# Patient Record
Sex: Female | Born: 1975
Health system: Southern US, Community
[De-identification: ages and names within clinical notes are randomized; demographics above are authoritative.]

## PROBLEM LIST (undated history)

## (undated) DIAGNOSIS — N939 Abnormal uterine and vaginal bleeding, unspecified: Principal | ICD-10-CM

## (undated) DIAGNOSIS — I10 Essential (primary) hypertension: Secondary | ICD-10-CM

## (undated) HISTORY — PX: ABDOMINOPLASTY/PANNICULECTOMY: SHX5578

## (undated) HISTORY — PX: NO PAST SURGERIES: SHX2092

## (undated) HISTORY — DX: Essential (primary) hypertension: I10

## (undated) HISTORY — DX: Abnormal uterine and vaginal bleeding, unspecified: N93.9

---

## 2005-05-27 ENCOUNTER — Ambulatory Visit (HOSPITAL_COMMUNITY): Admission: AD | Admit: 2005-05-27 | Discharge: 2005-05-27 | Payer: Self-pay | Admitting: Obstetrics and Gynecology

## 2005-05-30 ENCOUNTER — Inpatient Hospital Stay (HOSPITAL_COMMUNITY): Admission: AD | Admit: 2005-05-30 | Discharge: 2005-06-01 | Payer: Self-pay | Admitting: Obstetrics and Gynecology

## 2007-01-18 ENCOUNTER — Other Ambulatory Visit: Admission: RE | Admit: 2007-01-18 | Discharge: 2007-01-18 | Payer: Self-pay | Admitting: Obstetrics and Gynecology

## 2007-12-28 ENCOUNTER — Other Ambulatory Visit: Admission: RE | Admit: 2007-12-28 | Discharge: 2007-12-28 | Payer: Self-pay | Admitting: Obstetrics and Gynecology

## 2009-03-04 ENCOUNTER — Other Ambulatory Visit: Admission: RE | Admit: 2009-03-04 | Discharge: 2009-03-04 | Payer: Self-pay | Admitting: Obstetrics & Gynecology

## 2010-04-20 ENCOUNTER — Emergency Department (HOSPITAL_COMMUNITY)
Admission: EM | Admit: 2010-04-20 | Discharge: 2010-04-21 | Disposition: A | Discharge status: Home / Self Care | Payer: 59 | Attending: Emergency Medicine | Admitting: Emergency Medicine

## 2010-04-20 DIAGNOSIS — R110 Nausea: Secondary | ICD-10-CM | POA: Insufficient documentation

## 2010-04-20 DIAGNOSIS — H53149 Visual discomfort, unspecified: Secondary | ICD-10-CM | POA: Insufficient documentation

## 2010-04-20 DIAGNOSIS — R51 Headache: Secondary | ICD-10-CM | POA: Insufficient documentation

## 2010-06-05 NOTE — Op Note (Signed)
NAME:  Audrey Coleman, Audrey Coleman                ACCOUNT NO.:  [id number]   MEDICAL RECORD NO.:  [id number]          PATIENT TYPE:  INP   LOCATION:  A401                          FACILITY:  [facility]   PHYSICIAN:  Tilda Burrow, M.D. DATE OF BIRTH:  02/04/1975   DATE OF PROCEDURE:  05/30/2005  DATE OF DISCHARGE:                                 OPERATIVE REPORT   DELIVERY NOTE   [name] progressed steadily through labor, reaching completely dilated  shortly before 5 p.m.  I was called and on my arrival, the patient had  developed the urge to push irresistibly and had delivered just as I arrived  in the room under the controlled sterile vaginal delivery technique by Tawni Millers, RN.  We clamped the cord and placed the patient on the  abdomen. The cord was then cut by a family member.  Cord blood gases were  then obtained and then I delivered the placenta without difficulty. There  was second degree obstetric laceration that was easily repaired under the  effect of the epidural.  The placenta had noted to be marginal insertion of  the cord, but otherwise normal size.  Three vessel cord confirmed.   ESTIMATED BLOOD LOSS:  450 mL.   The patient was allowed to place in sitting position and epidural catheter  removed.  Tip visualized as intact.  Epidural had functioned excellently  throughout labor.  Condition going to recovery time excellent.      Tilda Burrow, M.D.  Electronically Signed     JVF/MEDQ  D:  05/30/2005  T:  05/31/2005  Job:  [id number]

## 2010-06-05 NOTE — Op Note (Signed)
NAME:  Tweedy, Coralee                ACCOUNT NO.:  [id number]   MEDICAL RECORD NO.:  [id number]          PATIENT TYPE:  INP   LOCATION:  A401                          FACILITY:  [facility]   PHYSICIAN:  Tilda Burrow, M.D. DATE OF BIRTH:  October 29, 1975   DATE OF PROCEDURE:  05/30/2005  DATE OF DISCHARGE:                                 OPERATIVE REPORT   OPERATION/PROCEDURE:  Epidural catheter placement.   DESCRIPTION OF PROCEDURE:  Continuous lumbar epidural placed on first  attempt at L2-3 interspace at a depth of 7 cm.  Prepping and draping of the  back was performed in the standard fashion.  Loss-of-resistance technique  used to identify the epidural space with 5 mL bolus of 1.25% lidocaine with  epinephrine injected.  Then epidural catheter placed 2 cm into the epidural  space and 2-3 cm.  It was stopped at that point where it met slight  resistance.  The catheter was taped to the back very firmly due to patient's  very mobile lax skin.  The patient had excellent analgesic effect, symmetric  at T10 level quickly developed.  She had a 9 mL bolus with Marcaine 0.125%  solution followed by 12 mL per hour continuous infusion.      Tilda Burrow, M.D.  Electronically Signed     JVF/MEDQ  D:  05/30/2005  T:  05/31/2005  Job:  [id number]

## 2010-06-05 NOTE — H&P (Signed)
NAME:  Audrey Coleman, Audrey Coleman                ACCOUNT NO.:  [id number]   MEDICAL RECORD NO.:  [id number]          PATIENT TYPE:  INP   LOCATION:  LDR2                          FACILITY:  [facility]   PHYSICIAN:  Tilda Burrow, M.D. DATE OF BIRTH:  01-30-1975   DATE OF ADMISSION:  05/30/2005  DATE OF DISCHARGE:  LH                                HISTORY & PHYSICAL   ADMISSION DIAGNOSIS:  Pregnancy at 39.[redacted] weeks gestation, prodrome of labor.   HISTORY OF PRESENT ILLNESS:  This is a 35 year old female, para 2, para 1,  AB0, LMP August 26, 2004, placing [facility] Jun 02, 2005, with corresponding first  and second trimester ultrasounds, was admitted after pregnancy course  followed in our office. She has had an uncomplicated course through 13  prenatal visits, with appropriate weight gain and fundal height growth.  Cervix is 3-4 cm, significantly unchanged from last prenatal visit.  Discussion with the patient who considered going home, but after discussion  with options and anticipation that she will be back shortly, she agrees to  go ahead to come in. Membranes are intact. There is light bloody show seen.  Amniotomy is not done at the time of admission, but will begin pitocin  augmentation if labor does not improve spontaneously in the next hour.   PAST MEDICAL HISTORY:  Benign.   PAST SURGICAL HISTORY:  Negative.   ALLERGIES:  None.   Height 5 feet 1 inch, weight 234 pounds which is a 44 weight gain, fundal  height term size fetus, vertex presentation, estimated fetal weight 7.5 to 8  pounds. Cervix 3-4 cm. On nursing evaluation vertex presentation confirmed.   PLAN:  Pitocin augmentation of labor after one hour. Good prognosis of  vaginal delivery.   ADDENDUM:  Prenatal labs reveal blood type O positive, urine drug screen  negative, rubella immune. Hemoglobin 13, hematocrit 40. Hepatitis, HIV, RPR,  GC, and Chlamydia all negative. Repeat GC and Chlamydia. Glucose tolerance  test 111 mg% at 28  weeks.   PLAN:  The patient plans epidural, breast feeding, bottle supplementation  with subsequent IUD use planned.      Tilda Burrow, M.D.  Electronically Signed     JVF/MEDQ  D:  05/30/2005  T:  05/30/2005  Job:  [id number]   cc:   Lorin Picket A. Gerda Diss, MD  Fax: [phone]

## 2012-01-06 ENCOUNTER — Other Ambulatory Visit (HOSPITAL_COMMUNITY)
Admission: RE | Admit: 2012-01-06 | Discharge: 2012-01-06 | Disposition: A | Discharge status: Home / Self Care | Payer: 59 | Source: Ambulatory Visit | Attending: Obstetrics and Gynecology | Admitting: Obstetrics and Gynecology

## 2012-01-06 DIAGNOSIS — Z1151 Encounter for screening for human papillomavirus (HPV): Secondary | ICD-10-CM | POA: Insufficient documentation

## 2012-01-06 DIAGNOSIS — Z01419 Encounter for gynecological examination (general) (routine) without abnormal findings: Secondary | ICD-10-CM | POA: Insufficient documentation

## 2012-01-19 NOTE — L&D Delivery Note (Signed)
Attestation of Attending Supervision of Advanced Practitioner (CNM/NP): Evaluation and management procedures were performed by the Advanced Practitioner under my supervision and collaboration. I have reviewed the Advanced Practitioner's note and chart, and I agree with the management and plan.  LEGGETT,KELLY H. 4:39 PM

## 2012-01-19 NOTE — L&D Delivery Note (Signed)
Delivery Note Pt progressed rapidly to C/C/+3 to delivering the infant over barley one push. RN delivered the baby, Dr. Madie Reno was in the room putting on her gloves. At 5:47 PM a viable female was delivered via Vaginal, Spontaneous Delivery (Presentation: vertex  ).  APGAR: 9,9 ; weight - not yet weighed.   Placenta status: Intact, Spontaneous. No Nuchal cord. No complications  Anesthesia: None  Episiotomy: None Lacerations: 2nd degree Suture Repair: 3.0 vicryl Est. Blood Loss (mL): 300  Mom to postpartum.  Baby to nursery-stable.  Marissa Calamity 09/28/2012, 6:15 PM  I was present a few minutes after the precipitous birth until after the repair.  CRESENZO-DISHMAN,Quintyn Dombek

## 2012-02-23 LAB — OB RESULTS CONSOLE HIV ANTIBODY (ROUTINE TESTING): HIV: NONREACTIVE

## 2012-02-23 LAB — OB RESULTS CONSOLE RUBELLA ANTIBODY, IGM: Rubella: IMMUNE

## 2012-02-23 LAB — OB RESULTS CONSOLE ABO/RH

## 2012-02-23 LAB — OB RESULTS CONSOLE VARICELLA ZOSTER ANTIBODY, IGG: Varicella: IMMUNE

## 2012-02-23 LAB — OB RESULTS CONSOLE RPR: RPR: NONREACTIVE

## 2012-03-27 ENCOUNTER — Other Ambulatory Visit: Payer: Self-pay | Admitting: Obstetrics & Gynecology

## 2012-03-27 DIAGNOSIS — Z36 Encounter for antenatal screening of mother: Secondary | ICD-10-CM

## 2012-03-29 ENCOUNTER — Encounter: Payer: Self-pay | Admitting: *Deleted

## 2012-03-29 DIAGNOSIS — I10 Essential (primary) hypertension: Secondary | ICD-10-CM

## 2012-04-05 ENCOUNTER — Ambulatory Visit (INDEPENDENT_AMBULATORY_CARE_PROVIDER_SITE_OTHER): Payer: 59

## 2012-04-05 ENCOUNTER — Encounter: Payer: Self-pay | Admitting: Advanced Practice Midwife

## 2012-04-05 ENCOUNTER — Other Ambulatory Visit: Payer: Self-pay | Admitting: Advanced Practice Midwife

## 2012-04-05 ENCOUNTER — Ambulatory Visit (INDEPENDENT_AMBULATORY_CARE_PROVIDER_SITE_OTHER): Payer: 59 | Admitting: Advanced Practice Midwife

## 2012-04-05 VITALS — BP 104/80 | Wt 226.0 lb

## 2012-04-05 DIAGNOSIS — Z36 Encounter for antenatal screening of mother: Secondary | ICD-10-CM

## 2012-04-05 DIAGNOSIS — Z3400 Encounter for supervision of normal first pregnancy, unspecified trimester: Secondary | ICD-10-CM

## 2012-04-05 DIAGNOSIS — O10019 Pre-existing essential hypertension complicating pregnancy, unspecified trimester: Secondary | ICD-10-CM

## 2012-04-05 DIAGNOSIS — O10911 Unspecified pre-existing hypertension complicating pregnancy, first trimester: Secondary | ICD-10-CM

## 2012-04-05 DIAGNOSIS — O09529 Supervision of elderly multigravida, unspecified trimester: Secondary | ICD-10-CM

## 2012-04-05 LAB — PROTEIN, URINE, 24 HOUR: Total Protein: 60 g/dL

## 2012-04-05 LAB — POCT URINALYSIS DIPSTICK
Leukocytes, UA: NEGATIVE
Protein, UA: NEGATIVE

## 2012-04-05 NOTE — Progress Notes (Signed)
Had NT today.  No c/o

## 2012-04-05 NOTE — Progress Notes (Unsigned)
U/s-single IUP with +FCA noted, CRL c/w dates, NT-1.77mm, NB present, cx long and closed, bilateral adnexa wnl-TMM

## 2012-04-05 NOTE — Addendum Note (Signed)
Addended by: Annett Fabian on: 04/05/2012 01:00 PM   Modules accepted: Orders

## 2012-04-13 LAB — MATERNAL SCREEN, INTEGRATED #1
Crown Rump Length: 63.3 mm
Nuchal translucency: 1.54 mm
Number of fetuses: 1
Referring Physician NPI: 1881783975
Referring physician phone: 3363426063

## 2012-05-01 ENCOUNTER — Other Ambulatory Visit: Payer: Self-pay | Admitting: Obstetrics & Gynecology

## 2012-05-01 ENCOUNTER — Ambulatory Visit (INDEPENDENT_AMBULATORY_CARE_PROVIDER_SITE_OTHER): Payer: 59 | Admitting: Obstetrics & Gynecology

## 2012-05-01 ENCOUNTER — Encounter: Payer: Self-pay | Admitting: Obstetrics & Gynecology

## 2012-05-01 VITALS — BP 110/80 | Wt 228.0 lb

## 2012-05-01 DIAGNOSIS — O10019 Pre-existing essential hypertension complicating pregnancy, unspecified trimester: Secondary | ICD-10-CM

## 2012-05-01 DIAGNOSIS — O10912 Unspecified pre-existing hypertension complicating pregnancy, second trimester: Secondary | ICD-10-CM

## 2012-05-01 DIAGNOSIS — O09529 Supervision of elderly multigravida, unspecified trimester: Secondary | ICD-10-CM

## 2012-05-01 DIAGNOSIS — O0990 Supervision of high risk pregnancy, unspecified, unspecified trimester: Secondary | ICD-10-CM

## 2012-05-01 LAB — POCT URINALYSIS DIPSTICK
Blood, UA: NEGATIVE
Glucose, UA: NEGATIVE
Nitrite, UA: NEGATIVE

## 2012-05-01 NOTE — Progress Notes (Signed)
BP weight and urine results all reviewed and noted. Patient reports good fetal movement, denies any bleeding and no rupture of membranes symptoms or regular contractions. Patient is without complaints. All questions were answered. 2nd IT today and sonogram next visit

## 2012-05-01 NOTE — Patient Instructions (Signed)

## 2012-05-04 LAB — MATERNAL SCREEN, INTEGRATED #2
AFP, Serum: 32.4 ng/mL
Age risk Down Syndrome: 1:190 {titer}
Calculated Gestational Age: 16.3
Inhibin A Dimeric: 156 pg/mL
Inhibin A MoM: 1.09
MSS Down Syndrome: <1:5000 {titer}
MSS Trisomy 18 Risk: <1:5000 {titer}
NT MoM: 1.07
Number of fetuses: 1
PAPP-A: 869 ng/mL
Rish for ONTD: <1:5000 {titer}
hCG MoM: 0.79

## 2012-05-23 ENCOUNTER — Encounter: Payer: Self-pay | Admitting: Women's Health

## 2012-05-23 ENCOUNTER — Ambulatory Visit (INDEPENDENT_AMBULATORY_CARE_PROVIDER_SITE_OTHER): Payer: 59 | Admitting: Women's Health

## 2012-05-23 ENCOUNTER — Telehealth: Payer: Self-pay | Admitting: Obstetrics and Gynecology

## 2012-05-23 VITALS — BP 120/80 | Wt 228.8 lb

## 2012-05-23 DIAGNOSIS — O09522 Supervision of elderly multigravida, second trimester: Secondary | ICD-10-CM

## 2012-05-23 DIAGNOSIS — O10019 Pre-existing essential hypertension complicating pregnancy, unspecified trimester: Secondary | ICD-10-CM

## 2012-05-23 DIAGNOSIS — IMO0002 Reserved for concepts with insufficient information to code with codable children: Secondary | ICD-10-CM

## 2012-05-23 DIAGNOSIS — O10912 Unspecified pre-existing hypertension complicating pregnancy, second trimester: Secondary | ICD-10-CM

## 2012-05-23 DIAGNOSIS — Z331 Pregnant state, incidental: Secondary | ICD-10-CM

## 2012-05-23 DIAGNOSIS — O9989 Other specified diseases and conditions complicating pregnancy, childbirth and the puerperium: Secondary | ICD-10-CM

## 2012-05-23 DIAGNOSIS — Z1389 Encounter for screening for other disorder: Secondary | ICD-10-CM

## 2012-05-23 DIAGNOSIS — O26892 Other specified pregnancy related conditions, second trimester: Secondary | ICD-10-CM

## 2012-05-23 DIAGNOSIS — O09529 Supervision of elderly multigravida, unspecified trimester: Secondary | ICD-10-CM

## 2012-05-23 DIAGNOSIS — B373 Candidiasis of vulva and vagina: Secondary | ICD-10-CM

## 2012-05-23 LAB — POCT URINALYSIS DIPSTICK
Nitrite, UA: NEGATIVE
Protein, UA: NEGATIVE

## 2012-05-23 LAB — POCT WET PREP (WET MOUNT): Clue Cells Wet Prep Whiff POC: NEGATIVE

## 2012-05-23 MED ORDER — AMLODIPINE BESYLATE 5 MG PO TABS: 5.0000 mg | ORAL_TABLET | Freq: Every day | ORAL | Status: DC

## 2012-05-23 MED ORDER — FLUCONAZOLE 150 MG PO TABS: 150.0000 mg | ORAL_TABLET | Freq: Once | ORAL | Status: DC

## 2012-05-23 NOTE — Telephone Encounter (Signed)
Pt states been having contractions since 4 am this morning with a yellowish mucus discharge, no bleeding, not feeling the baby move "as much". Pt told to be here today to see provider at 2:15 pm

## 2012-05-23 NOTE — Patient Instructions (Addendum)
Increase your water intake until your urine is clear.  Rest as much as possible.  No sex until cramping stops.  Notify us or go to [facility] hospital for increasing cramps, leakage of fluid like your water broke, or vaginal bleeding. Monilial Vaginitis Vaginitis in a soreness, swelling and redness (inflammation) of the vagina and vulva. Monilial vaginitis is not a sexually transmitted infection. CAUSES  Yeast vaginitis is caused by yeast (candida) that is normally found in your vagina. With a yeast infection, the candida has overgrown in number to a point that upsets the chemical balance. SYMPTOMS   Rodd, thick vaginal discharge.  Swelling, itching, redness and irritation of the vagina and possibly the lips of the vagina (vulva).  Burning or painful urination.  Painful intercourse. DIAGNOSIS  Things that may contribute to monilial vaginitis are:  Postmenopausal and virginal states.  Pregnancy.  Infections.  Being tired, sick or stressed, especially if you had monilial vaginitis in the past.  Diabetes. Good control will help lower the chance.  Birth control pills.  Tight fitting garments.  Using bubble bath, feminine sprays, douches or deodorant tampons.  Taking certain medications that kill germs (antibiotics).  Sporadic recurrence can occur if you become ill. TREATMENT  Your caregiver will give you medication.  There are several kinds of anti monilial vaginal creams and suppositories specific for monilial vaginitis. For recurrent yeast infections, use a suppository or cream in the vagina 2 times a week, or as directed.  Anti-monilial or steroid cream for the itching or irritation of the vulva may also be used. Get your caregiver's permission.  Painting the vagina with methylene blue solution may help if the monilial cream does not work.  Eating yogurt may help prevent monilial vaginitis. HOME CARE INSTRUCTIONS   Finish all medication as prescribed.  Do not have sex  until treatment is completed or after your caregiver tells you it is okay.  Take warm sitz baths.  Do not douche.  Do not use tampons, especially scented ones.  Wear cotton underwear.  Avoid tight pants and panty hose.  Tell your sexual partner that you have a yeast infection. They should go to their caregiver if they have symptoms such as mild rash or itching.  Your sexual partner should be treated as well if your infection is difficult to eliminate.  Practice safer sex. Use condoms.  Some vaginal medications cause latex condoms to fail. Vaginal medications that harm condoms are:  Cleocin cream.  Butoconazole (Femstat).  Terconazole (Terazol) vaginal suppository.  Miconazole (Monistat) (may be purchased over the counter). SEEK MEDICAL CARE IF:   You have a temperature by mouth above 102 F (38.9 C).  The infection is getting worse after 2 days of treatment.  The infection is not getting better after 3 days of treatment.  You develop blisters in or around your vagina.  You develop vaginal bleeding, and it is not your menstrual period.  You have pain when you urinate.  You develop intestinal problems.  You have pain with sexual intercourse. Document Released: 10/14/2004 Document Revised: 03/29/2011 Document Reviewed: 06/28/2008 [facility] Patient Information 2013 [place], [place].

## 2012-05-23 NOTE — Progress Notes (Signed)
Reports good fm. Denies lof, vb, urinary frequency, urgency, hesitancy, or dysuria.  Has had cramps all day today, yellowish non-odorous d/c since yesterday, w/ occasional itching. Spec exam: cx visually closed, small amount whitish-yellow non-odorous d/c, wet prep + yeast. Rx diflucan. Increase po fluids, rest, pelvic rest. Reviewed warning s/s to report.  Refilled norvasc. Keep next appt as scheduled on mon for anatomy u/s and visit. All questions answered.

## 2012-05-23 NOTE — Progress Notes (Signed)
Starts as a cramp then stomach tightens up. Vaginal discharge.

## 2012-05-29 ENCOUNTER — Encounter: Payer: Self-pay | Admitting: Women's Health

## 2012-05-29 ENCOUNTER — Ambulatory Visit (INDEPENDENT_AMBULATORY_CARE_PROVIDER_SITE_OTHER): Payer: 59

## 2012-05-29 ENCOUNTER — Other Ambulatory Visit: Payer: Self-pay | Admitting: Obstetrics & Gynecology

## 2012-05-29 ENCOUNTER — Ambulatory Visit (INDEPENDENT_AMBULATORY_CARE_PROVIDER_SITE_OTHER): Payer: 59 | Admitting: Women's Health

## 2012-05-29 VITALS — BP 116/80 | Wt 230.0 lb

## 2012-05-29 DIAGNOSIS — O09522 Supervision of elderly multigravida, second trimester: Secondary | ICD-10-CM

## 2012-05-29 DIAGNOSIS — O10012 Pre-existing essential hypertension complicating pregnancy, second trimester: Secondary | ICD-10-CM

## 2012-05-29 DIAGNOSIS — O10019 Pre-existing essential hypertension complicating pregnancy, unspecified trimester: Secondary | ICD-10-CM

## 2012-05-29 DIAGNOSIS — O09529 Supervision of elderly multigravida, unspecified trimester: Secondary | ICD-10-CM

## 2012-05-29 DIAGNOSIS — Z1389 Encounter for screening for other disorder: Secondary | ICD-10-CM

## 2012-05-29 DIAGNOSIS — Z331 Pregnant state, incidental: Secondary | ICD-10-CM

## 2012-05-29 DIAGNOSIS — O10912 Unspecified pre-existing hypertension complicating pregnancy, second trimester: Secondary | ICD-10-CM

## 2012-05-29 DIAGNOSIS — O09899 Supervision of other high risk pregnancies, unspecified trimester: Secondary | ICD-10-CM

## 2012-05-29 DIAGNOSIS — O0990 Supervision of high risk pregnancy, unspecified, unspecified trimester: Secondary | ICD-10-CM

## 2012-05-29 LAB — POCT URINALYSIS DIPSTICK
Glucose, UA: NEGATIVE
Leukocytes, UA: NEGATIVE
Nitrite, UA: NEGATIVE

## 2012-05-29 NOTE — Patient Instructions (Addendum)
Pregnancy - Second Trimester The second trimester of pregnancy (3 to 6 months) is a period of rapid growth for you and your baby. At the end of the sixth month, your baby is about 9 inches long and weighs 1 1/2 pounds. You will begin to feel the baby move between 18 and 20 weeks of the pregnancy. This is called quickening. Weight gain is faster. A clear fluid (colostrum) may leak out of your breasts. You may feel small contractions of the womb (uterus). This is known as false labor or Braxton-Hicks contractions. This is like a practice for labor when the baby is ready to be born. Usually, the problems with morning sickness have usually passed by the end of your first trimester. Some women develop small dark blotches (called cholasma, mask of pregnancy) on their face that usually goes away after the baby is born. Exposure to the sun makes the blotches worse. Acne may also develop in some pregnant women and pregnant women who have acne, may find that it goes away. PRENATAL EXAMS  Blood work may continue to be done during prenatal exams. These tests are done to check on your health and the probable health of your baby. Blood work is used to follow your blood levels (hemoglobin). Anemia (low hemoglobin) is common during pregnancy. Iron and vitamins are given to help prevent this. You will also be checked for diabetes between 24 and 28 weeks of the pregnancy. Some of the previous blood tests may be repeated.  The size of the uterus is measured during each visit. This is to make sure that the baby is continuing to grow properly according to the dates of the pregnancy.  Your blood pressure is checked every prenatal visit. This is to make sure you are not getting toxemia.  Your urine is checked to make sure you do not have an infection, diabetes or protein in the urine.  Your weight is checked often to make sure gains are happening at the suggested rate. This is to ensure that both you and your baby are growing  normally.  Sometimes, an ultrasound is performed to confirm the proper growth and development of the baby. This is a test which bounces harmless sound waves off the baby so your caregiver can more accurately determine due dates. Sometimes, a specialized test is done on the amniotic fluid surrounding the baby. This test is called an amniocentesis. The amniotic fluid is obtained by sticking a needle into the belly (abdomen). This is done to check the chromosomes in instances where there is a concern about possible genetic problems with the baby. It is also sometimes done near the end of pregnancy if an early delivery is required. In this case, it is done to help make sure the baby's lungs are mature enough for the baby to live outside of the womb. CHANGES OCCURING IN THE SECOND TRIMESTER OF PREGNANCY Your body goes through many changes during pregnancy. They vary from person to person. Talk to your caregiver about changes you notice that you are concerned about.  During the second trimester, you will likely have an increase in your appetite. It is normal to have cravings for certain foods. This varies from person to person and pregnancy to pregnancy.  Your lower abdomen will begin to bulge.  You may have to urinate more often because the uterus and baby are pressing on your bladder. It is also common to get more bladder infections during pregnancy (pain with urination). You can help this by   drinking lots of fluids and emptying your bladder before and after intercourse.  You may begin to get stretch marks on your hips, abdomen, and breasts. These are normal changes in the body during pregnancy. There are no exercises or medications to take that prevent this change.  You may begin to develop swollen and bulging veins (varicose veins) in your legs. Wearing support hose, elevating your feet for 15 minutes, 3 to 4 times a day and limiting salt in your diet helps lessen the problem.  Heartburn may develop  as the uterus grows and pushes up against the stomach. Antacids recommended by your caregiver helps with this problem. Also, eating smaller meals 4 to 5 times a day helps.  Constipation can be treated with a stool softener or adding bulk to your diet. Drinking lots of fluids, vegetables, fruits, and whole grains are helpful.  Exercising is also helpful. If you have been very active up until your pregnancy, most of these activities can be continued during your pregnancy. If you have been less active, it is helpful to start an exercise program such as walking.  Hemorrhoids (varicose veins in the rectum) may develop at the end of the second trimester. Warm sitz baths and hemorrhoid cream recommended by your caregiver helps hemorrhoid problems.  Backaches may develop during this time of your pregnancy. Avoid heavy lifting, wear low heal shoes and practice good posture to help with backache problems.  Some pregnant women develop tingling and numbness of their hand and fingers because of swelling and tightening of ligaments in the wrist (carpel tunnel syndrome). This goes away after the baby is born.  As your breasts enlarge, you may have to get a bigger bra. Get a comfortable, cotton, support bra. Do not get a nursing bra until the last month of the pregnancy if you will be nursing the baby.  You may get a dark line from your belly button to the pubic area called the linea nigra.  You may develop rosy cheeks because of increase blood flow to the face.  You may develop spider looking lines of the face, neck, arms and chest. These go away after the baby is born. HOME CARE INSTRUCTIONS   It is extremely important to avoid all smoking, herbs, alcohol, and unprescribed drugs during your pregnancy. These chemicals affect the formation and growth of the baby. Avoid these chemicals throughout the pregnancy to ensure the delivery of a healthy infant.  Most of your home care instructions are the same as  suggested for the first trimester of your pregnancy. Keep your caregiver's appointments. Follow your caregiver's instructions regarding medication use, exercise and diet.  During pregnancy, you are providing food for you and your baby. Continue to eat regular, well-balanced meals. Choose foods such as meat, fish, milk and other low fat dairy products, vegetables, fruits, and whole-grain breads and cereals. Your caregiver will tell you of the ideal weight gain.  A physical sexual relationship may be continued up until near the end of pregnancy if there are no other problems. Problems could include early (premature) leaking of amniotic fluid from the membranes, vaginal bleeding, abdominal pain, or other medical or pregnancy problems.  Exercise regularly if there are no restrictions. Check with your caregiver if you are unsure of the safety of some of your exercises. The greatest weight gain will occur in the last 2 trimesters of pregnancy. Exercise will help you:  Control your weight.  Get you in shape for labor and delivery.  Lose weight   after you have the baby.  Wear a good support or jogging bra for breast tenderness during pregnancy. This may help if worn during sleep. Pads or tissues may be used in the bra if you are leaking colostrum.  Do not use hot tubs, steam rooms or saunas throughout the pregnancy.  Wear your seat belt at all times when driving. This protects you and your baby if you are in an accident.  Avoid raw meat, uncooked cheese, cat litter boxes and soil used by cats. These carry germs that can cause birth defects in the baby.  The second trimester is also a good time to visit your dentist for your dental health if this has not been done yet. Getting your teeth cleaned is OK. Use a soft toothbrush. Brush gently during pregnancy.  It is easier to loose urine during pregnancy. Tightening up and strengthening the pelvic muscles will help with this problem. Practice stopping your  urination while you are going to the bathroom. These are the same muscles you need to strengthen. It is also the muscles you would use as if you were trying to stop from passing gas. You can practice tightening these muscles up 10 times a set and repeating this about 3 times per day. Once you know what muscles to tighten up, do not perform these exercises during urination. It is more likely to contribute to an infection by backing up the urine.  Ask for help if you have financial, counseling or nutritional needs during pregnancy. Your caregiver will be able to offer counseling for these needs as well as refer you for other special needs.  Your skin may become oily. If so, wash your face with mild soap, use non-greasy moisturizer and oil or cream based makeup. MEDICATIONS AND DRUG USE IN PREGNANCY  Take prenatal vitamins as directed. The vitamin should contain 1 milligram of folic acid. Keep all vitamins out of reach of children. Only a couple vitamins or tablets containing iron may be fatal to a baby or young child when ingested.  Avoid use of all medications, including herbs, over-the-counter medications, not prescribed or suggested by your caregiver. Only take over-the-counter or prescription medicines for pain, discomfort, or fever as directed by your caregiver. Do not use aspirin.  Let your caregiver also know about herbs you may be using.  Alcohol is related to a number of birth defects. This includes fetal alcohol syndrome. All alcohol, in any form, should be avoided completely. Smoking will cause low birth rate and premature babies.  Street or illegal drugs are very harmful to the baby. They are absolutely forbidden. A baby born to an addicted mother will be addicted at birth. The baby will go through the same withdrawal an adult does. SEEK MEDICAL CARE IF:  You have any concerns or worries during your pregnancy. It is better to call with your questions if you feel they cannot wait, rather  than worry about them. SEEK IMMEDIATE MEDICAL CARE IF:   An unexplained oral temperature above 102 F (38.9 C) develops, or as your caregiver suggests.  You have leaking of fluid from the vagina (birth canal). If leaking membranes are suspected, take your temperature and tell your caregiver of this when you call.  There is vaginal spotting, bleeding, or passing clots. Tell your caregiver of the amount and how many pads are used. Light spotting in pregnancy is common, especially following intercourse.  You develop a bad smelling vaginal discharge with a change in the color from clear   to [place].  You continue to feel sick to your stomach (nauseated) and have no relief from remedies suggested. You vomit blood or coffee ground-like materials.  You lose more than 2 pounds of weight or gain more than 2 pounds of weight over 1 week, or as suggested by your caregiver.  You notice swelling of your face, hands, feet, or legs.  You get exposed to German measles and have never had them.  You are exposed to fifth disease or chickenpox.  You develop belly (abdominal) pain. Round ligament discomfort is a common non-cancerous (benign) cause of abdominal pain in pregnancy. Your caregiver still must evaluate you.  You develop a bad headache that does not go away.  You develop fever, diarrhea, pain with urination, or shortness of breath.  You develop visual problems, blurry, or double vision.  You fall or are in a car accident or any kind of trauma.  There is mental or physical violence at home. Document Released: 12/29/2000 Document Revised: 03/29/2011 Document Reviewed: 07/03/2008 [employer] Patient Information 2013 [employer].  

## 2012-05-29 NOTE — Progress Notes (Signed)
U/S (20+2wks)-active fetus, meas c/w dates, fluid wnl, ant gr 0 plac, no obvious abnl noted, female fetus, cx long and closed, bilateral adnexa wnl

## 2012-05-29 NOTE — Progress Notes (Signed)
Pressure sometimes. Still feels baby move but not as much.

## 2012-05-29 NOTE — Progress Notes (Signed)
States she doesn't feel like baby is moving much- but just saw lots of fm on u/s screen that she didn't perceived. Reviewed results of u/s today. Denies uc's, lof, vb, urinary frequency, urgency, hesitancy, or dysuria.  No complaints.  All questions answered. Declines childbirth classes, but desires tour of [facility]- info given. 4wks for LROB

## 2012-06-01 LAB — US OB DETAIL + 14 WK

## 2012-06-16 ENCOUNTER — Encounter: Payer: Self-pay | Admitting: Adult Health

## 2012-06-16 ENCOUNTER — Ambulatory Visit (INDEPENDENT_AMBULATORY_CARE_PROVIDER_SITE_OTHER): Payer: 59 | Admitting: Adult Health

## 2012-06-16 VITALS — BP 122/88 | Wt 230.0 lb

## 2012-06-16 DIAGNOSIS — B379 Candidiasis, unspecified: Secondary | ICD-10-CM

## 2012-06-16 DIAGNOSIS — O2390 Unspecified genitourinary tract infection in pregnancy, unspecified trimester: Secondary | ICD-10-CM

## 2012-06-16 DIAGNOSIS — Z331 Pregnant state, incidental: Secondary | ICD-10-CM

## 2012-06-16 DIAGNOSIS — Z1389 Encounter for screening for other disorder: Secondary | ICD-10-CM

## 2012-06-16 LAB — POCT URINALYSIS DIPSTICK
Glucose, UA: NEGATIVE
Ketones, UA: NEGATIVE

## 2012-06-16 LAB — POCT WET PREP (WET MOUNT): WBC, Wet Prep HPF POC: NEGATIVE

## 2012-06-16 MED ORDER — FLUCONAZOLE 150 MG PO TABS: ORAL_TABLET | ORAL | Status: DC

## 2012-06-16 NOTE — Progress Notes (Signed)
C/o clear to Rickel thick discharge with odor, itching and irritation.

## 2012-06-16 NOTE — Progress Notes (Signed)
Has discharge and itching wet prep+ yeast will Rx diflucan 150 mg 1 now and 1 in 3 days if needed, with 1 refill.Cervix closed, no bleeding, good fetal movement,call prn, return as scheduled

## 2012-06-16 NOTE — Patient Instructions (Addendum)
Monilial Vaginitis Vaginitis in a soreness, swelling and redness (inflammation) of the vagina and vulva. Monilial vaginitis is not a sexually transmitted infection. CAUSES  Yeast vaginitis is caused by yeast (candida) that is normally found in your vagina. With a yeast infection, the candida has overgrown in number to a point that upsets the chemical balance. SYMPTOMS   Biddy, thick vaginal discharge.  Swelling, itching, redness and irritation of the vagina and possibly the lips of the vagina (vulva).  Burning or painful urination.  Painful intercourse. DIAGNOSIS  Things that may contribute to monilial vaginitis are:  Postmenopausal and virginal states.  Pregnancy.  Infections.  Being tired, sick or stressed, especially if you had monilial vaginitis in the past.  Diabetes. Good control will help lower the chance.  Birth control pills.  Tight fitting garments.  Using bubble bath, feminine sprays, douches or deodorant tampons.  Taking certain medications that kill germs (antibiotics).  Sporadic recurrence can occur if you become ill. TREATMENT  Your caregiver will give you medication.  There are several kinds of anti monilial vaginal creams and suppositories specific for monilial vaginitis. For recurrent yeast infections, use a suppository or cream in the vagina 2 times a week, or as directed.  Anti-monilial or steroid cream for the itching or irritation of the vulva may also be used. Get your caregiver's permission.  Painting the vagina with methylene blue solution may help if the monilial cream does not work.  Eating yogurt may help prevent monilial vaginitis. HOME CARE INSTRUCTIONS   Finish all medication as prescribed.  Do not have sex until treatment is completed or after your caregiver tells you it is okay.  Take warm sitz baths.  Do not douche.  Do not use tampons, especially scented ones.  Wear cotton underwear.  Avoid tight pants and panty  hose.  Tell your sexual partner that you have a yeast infection. They should go to their caregiver if they have symptoms such as mild rash or itching.  Your sexual partner should be treated as well if your infection is difficult to eliminate.  Practice safer sex. Use condoms.  Some vaginal medications cause latex condoms to fail. Vaginal medications that harm condoms are:  Cleocin cream.  Butoconazole (Femstat).  Terconazole (Terazol) vaginal suppository.  Miconazole (Monistat) (may be purchased over the counter). SEEK MEDICAL CARE IF:   You have a temperature by mouth above 102 F (38.9 C).  The infection is getting worse after 2 days of treatment.  The infection is not getting better after 3 days of treatment.  You develop blisters in or around your vagina.  You develop vaginal bleeding, and it is not your menstrual period.  You have pain when you urinate.  You develop intestinal problems.  You have pain with sexual intercourse. Document Released: 10/14/2004 Document Revised: 03/29/2011 Document Reviewed: 06/28/2008 [facility] Patient Information 2014 [place], [place]. Take diflucan Follow as scheduled

## 2012-06-26 ENCOUNTER — Encounter: Payer: Self-pay | Admitting: Women's Health

## 2012-06-26 ENCOUNTER — Ambulatory Visit (INDEPENDENT_AMBULATORY_CARE_PROVIDER_SITE_OTHER): Payer: 59 | Admitting: Women's Health

## 2012-06-26 VITALS — BP 120/80 | Wt 238.0 lb

## 2012-06-26 DIAGNOSIS — Z331 Pregnant state, incidental: Secondary | ICD-10-CM

## 2012-06-26 DIAGNOSIS — Z1389 Encounter for screening for other disorder: Secondary | ICD-10-CM

## 2012-06-26 DIAGNOSIS — O10019 Pre-existing essential hypertension complicating pregnancy, unspecified trimester: Secondary | ICD-10-CM

## 2012-06-26 DIAGNOSIS — O09899 Supervision of other high risk pregnancies, unspecified trimester: Secondary | ICD-10-CM

## 2012-06-26 DIAGNOSIS — O09529 Supervision of elderly multigravida, unspecified trimester: Secondary | ICD-10-CM

## 2012-06-26 LAB — POCT URINALYSIS DIPSTICK
Blood, UA: NEGATIVE
Glucose, UA: NEGATIVE
Nitrite, UA: NEGATIVE

## 2012-06-26 NOTE — Progress Notes (Signed)
Reports good fm. Denies uc's, lof, vb, urinary frequency, urgency, hesitancy, or dysuria.  Reports LBP, recommended maternity belt and reviewed relief measures.  Reviewed ptl s/s, fm, s/s to report.  All questions answered. F/U in 4wks for PN2, growth/afi u/s, and visit.

## 2012-06-26 NOTE — Patient Instructions (Addendum)
You will have your sugar test next visit.  Please do not eat or drink anything after midnight the night before you come, not even water.  You will be here for at least two hours.     Pregnancy - Second Trimester The second trimester of pregnancy (3 to 6 months) is a period of rapid growth for you and your baby. At the end of the sixth month, your baby is about 9 inches long and weighs 1 1/2 pounds. You will begin to feel the baby move between 18 and 20 weeks of the pregnancy. This is called quickening. Weight gain is faster. A clear fluid (colostrum) may leak out of your breasts. You may feel small contractions of the womb (uterus). This is known as false labor or Braxton-Hicks contractions. This is like a practice for labor when the baby is ready to be born. Usually, the problems with morning sickness have usually passed by the end of your first trimester. Some women develop small dark blotches (called cholasma, mask of pregnancy) on their face that usually goes away after the baby is born. Exposure to the sun makes the blotches worse. Acne may also develop in some pregnant women and pregnant women who have acne, may find that it goes away. PRENATAL EXAMS  Blood work may continue to be done during prenatal exams. These tests are done to check on your health and the probable health of your baby. Blood work is used to follow your blood levels (hemoglobin). Anemia (low hemoglobin) is common during pregnancy. Iron and vitamins are given to help prevent this. You will also be checked for diabetes between 24 and 28 weeks of the pregnancy. Some of the previous blood tests may be repeated.  The size of the uterus is measured during each visit. This is to make sure that the baby is continuing to grow properly according to the dates of the pregnancy.  Your blood pressure is checked every prenatal visit. This is to make sure you are not getting toxemia.  Your urine is checked to make sure you do not have an  infection, diabetes or protein in the urine.  Your weight is checked often to make sure gains are happening at the suggested rate. This is to ensure that both you and your baby are growing normally.  Sometimes, an ultrasound is performed to confirm the proper growth and development of the baby. This is a test which bounces harmless sound waves off the baby so your caregiver can more accurately determine due dates. Sometimes, a test is done on the amniotic fluid surrounding the baby. This test is called an amniocentesis. The amniotic fluid is obtained by sticking a needle into the belly (abdomen). This is done to check the chromosomes in instances where there is a concern about possible genetic problems with the baby. It is also sometimes done near the end of pregnancy if an early delivery is required. In this case, it is done to help make sure the baby's lungs are mature enough for the baby to live outside of the womb. CHANGES OCCURING IN THE SECOND TRIMESTER OF PREGNANCY Your body goes through many changes during pregnancy. They vary from person to person. Talk to your caregiver about changes you notice that you are concerned about.  During the second trimester, you will likely have an increase in your appetite. It is normal to have cravings for certain foods. This varies from person to person and pregnancy to pregnancy.  Your lower abdomen will begin  to bulge.  You may have to urinate more often because the uterus and baby are pressing on your bladder. It is also common to get more bladder infections during pregnancy. You can help this by drinking lots of fluids and emptying your bladder before and after intercourse.  You may begin to get stretch marks on your hips, abdomen, and breasts. These are normal changes in the body during pregnancy. There are no exercises or medicines to take that prevent this change.  You may begin to develop swollen and bulging veins (varicose veins) in your legs.  Wearing support hose, elevating your feet for 15 minutes, 3 to 4 times a day and limiting salt in your diet helps lessen the problem.  Heartburn may develop as the uterus grows and pushes up against the stomach. Antacids recommended by your caregiver helps with this problem. Also, eating smaller meals 4 to 5 times a day helps.  Constipation can be treated with a stool softener or adding bulk to your diet. Drinking lots of fluids, and eating vegetables, fruits, and whole grains are helpful.  Exercising is also helpful. If you have been very active up until your pregnancy, most of these activities can be continued during your pregnancy. If you have been less active, it is helpful to start an exercise program such as walking.  Hemorrhoids may develop at the end of the second trimester. Warm sitz baths and hemorrhoid cream recommended by your caregiver helps hemorrhoid problems.  Backaches may develop during this time of your pregnancy. Avoid heavy lifting, wear low heal shoes, and practice good posture to help with backache problems.  Some pregnant women develop tingling and numbness of their hand and fingers because of swelling and tightening of ligaments in the wrist (carpel tunnel syndrome). This goes away after the baby is born.  As your breasts enlarge, you may have to get a bigger bra. Get a comfortable, cotton, support bra. Do not get a nursing bra until the last month of the pregnancy if you will be nursing the baby.  You may get a dark line from your belly button to the pubic area called the linea nigra.  You may develop rosy cheeks because of increase blood flow to the face.  You may develop spider looking lines of the face, neck, arms, and chest. These go away after the baby is born. HOME CARE INSTRUCTIONS   It is extremely important to avoid all smoking, herbs, alcohol, and unprescribed drugs during your pregnancy. These chemicals affect the formation and growth of the baby. Avoid  these chemicals throughout the pregnancy to ensure the delivery of a healthy infant.  Most of your home care instructions are the same as suggested for the first trimester of your pregnancy. Keep your caregiver's appointments. Follow your caregiver's instructions regarding medicine use, exercise, and diet.  During pregnancy, you are providing food for you and your baby. Continue to eat regular, well-balanced meals. Choose foods such as meat, fish, milk and other low fat dairy products, vegetables, fruits, and whole-grain breads and cereals. Your caregiver will tell you of the ideal weight gain.  A physical sexual relationship may be continued up until near the end of pregnancy if there are no other problems. Problems could include early (premature) leaking of amniotic fluid from the membranes, vaginal bleeding, abdominal pain, or other medical or pregnancy problems.  Exercise regularly if there are no restrictions. Check with your caregiver if you are unsure of the safety of some of your exercises.  The greatest weight gain will occur in the last 2 trimesters of pregnancy. Exercise will help you:  Control your weight.  Get you in shape for labor and delivery.  Lose weight after you have the baby.  Wear a good support or jogging bra for breast tenderness during pregnancy. This may help if worn during sleep. Pads or tissues may be used in the bra if you are leaking colostrum.  Do not use hot tubs, steam rooms or saunas throughout the pregnancy.  Wear your seat belt at all times when driving. This protects you and your baby if you are in an accident.  Avoid raw meat, uncooked cheese, cat litter boxes, and soil used by cats. These carry germs that can cause birth defects in the baby.  The second trimester is also a good time to visit your dentist for your dental health if this has not been done yet. Getting your teeth cleaned is okay. Use a soft toothbrush. Brush gently during pregnancy.  It is  easier to leak urine during pregnancy. Tightening up and strengthening the pelvic muscles will help with this problem. Practice stopping your urination while you are going to the bathroom. These are the same muscles you need to strengthen. It is also the muscles you would use as if you were trying to stop from passing gas. You can practice tightening these muscles up 10 times a set and repeating this about 3 times per day. Once you know what muscles to tighten up, do not perform these exercises during urination. It is more likely to contribute to an infection by backing up the urine.  Ask for help if you have financial, counseling, or nutritional needs during pregnancy. Your caregiver will be able to offer counseling for these needs as well as refer you for other special needs.  Your skin may become oily. If so, wash your face with mild soap, use non-greasy moisturizer and oil or cream based makeup. MEDICINES AND DRUG USE IN PREGNANCY  Take prenatal vitamins as directed. The vitamin should contain 1 milligram of folic acid. Keep all vitamins out of reach of children. Only a couple vitamins or tablets containing iron may be fatal to a baby or young child when ingested.  Avoid use of all medicines, including herbs, over-the-counter medicines, not prescribed or suggested by your caregiver. Only take over-the-counter or prescription medicines for pain, discomfort, or fever as directed by your caregiver. Do not use aspirin.  Let your caregiver also know about herbs you may be using.  Alcohol is related to a number of birth defects. This includes fetal alcohol syndrome. All alcohol, in any form, should be avoided completely. Smoking will cause low birth rate and premature babies.  Street or illegal drugs are very harmful to the baby. They are absolutely forbidden. A baby born to an addicted mother will be addicted at birth. The baby will go through the same withdrawal an adult does. SEEK MEDICAL CARE IF:    You have any concerns or worries during your pregnancy. It is better to call with your questions if you feel they cannot wait, rather than worry about them. SEEK IMMEDIATE MEDICAL CARE IF:   An unexplained oral temperature above 102 F (38.9 C) develops, or as your caregiver suggests.  You have leaking of fluid from the vagina (birth canal). If leaking membranes are suspected, take your temperature and tell your caregiver of this when you call.  There is vaginal spotting, bleeding, or passing clots. Tell your caregiver  of the amount and how many pads are used. Light spotting in pregnancy is common, especially following intercourse.  You develop a bad smelling vaginal discharge with a change in the color from clear to [place].  You continue to feel sick to your stomach (nauseated) and have no relief from remedies suggested. You vomit blood or coffee ground-like materials.  You lose more than 2 pounds of weight or gain more than 2 pounds of weight over 1 week, or as suggested by your caregiver.  You notice swelling of your face, hands, feet, or legs.  You get exposed to [place] measles and have never had them.  You are exposed to fifth disease or chickenpox.  You develop belly (abdominal) pain. Round ligament discomfort is a common non-cancerous (benign) cause of abdominal pain in pregnancy. Your caregiver still must evaluate you.  You develop a bad headache that does not go away.  You develop fever, diarrhea, pain with urination, or shortness of breath.  You develop visual problems, blurry, or double vision.  You fall or are in a car accident or any kind of trauma.  There is mental or physical violence at home. Document Released: 12/29/2000 Document Revised: 09/29/2011 Document Reviewed: 07/03/2008 [facility] Patient Information 2014 [place], [place].

## 2012-07-19 ENCOUNTER — Other Ambulatory Visit: Payer: Self-pay | Admitting: Obstetrics & Gynecology

## 2012-07-19 DIAGNOSIS — O09522 Supervision of elderly multigravida, second trimester: Secondary | ICD-10-CM

## 2012-07-19 DIAGNOSIS — O10012 Pre-existing essential hypertension complicating pregnancy, second trimester: Secondary | ICD-10-CM

## 2012-07-24 ENCOUNTER — Ambulatory Visit (INDEPENDENT_AMBULATORY_CARE_PROVIDER_SITE_OTHER): Payer: 59 | Admitting: Women's Health

## 2012-07-24 ENCOUNTER — Ambulatory Visit (INDEPENDENT_AMBULATORY_CARE_PROVIDER_SITE_OTHER): Payer: 59

## 2012-07-24 ENCOUNTER — Encounter: Payer: Self-pay | Admitting: Women's Health

## 2012-07-24 ENCOUNTER — Other Ambulatory Visit: Payer: 59

## 2012-07-24 VITALS — BP 110/80 | Wt 242.0 lb

## 2012-07-24 DIAGNOSIS — O10012 Pre-existing essential hypertension complicating pregnancy, second trimester: Secondary | ICD-10-CM

## 2012-07-24 DIAGNOSIS — O09529 Supervision of elderly multigravida, unspecified trimester: Secondary | ICD-10-CM

## 2012-07-24 DIAGNOSIS — O10019 Pre-existing essential hypertension complicating pregnancy, unspecified trimester: Secondary | ICD-10-CM

## 2012-07-24 DIAGNOSIS — O1213 Gestational proteinuria, third trimester: Secondary | ICD-10-CM

## 2012-07-24 DIAGNOSIS — O0993 Supervision of high risk pregnancy, unspecified, third trimester: Secondary | ICD-10-CM

## 2012-07-24 DIAGNOSIS — E669 Obesity, unspecified: Secondary | ICD-10-CM

## 2012-07-24 DIAGNOSIS — O09523 Supervision of elderly multigravida, third trimester: Secondary | ICD-10-CM

## 2012-07-24 DIAGNOSIS — Z1389 Encounter for screening for other disorder: Secondary | ICD-10-CM

## 2012-07-24 DIAGNOSIS — O99019 Anemia complicating pregnancy, unspecified trimester: Secondary | ICD-10-CM

## 2012-07-24 DIAGNOSIS — Z3482 Encounter for supervision of other normal pregnancy, second trimester: Secondary | ICD-10-CM

## 2012-07-24 DIAGNOSIS — O2390 Unspecified genitourinary tract infection in pregnancy, unspecified trimester: Secondary | ICD-10-CM

## 2012-07-24 DIAGNOSIS — O2343 Unspecified infection of urinary tract in pregnancy, third trimester: Secondary | ICD-10-CM

## 2012-07-24 DIAGNOSIS — O09522 Supervision of elderly multigravida, second trimester: Secondary | ICD-10-CM

## 2012-07-24 DIAGNOSIS — O10913 Unspecified pre-existing hypertension complicating pregnancy, third trimester: Secondary | ICD-10-CM

## 2012-07-24 LAB — POCT URINALYSIS DIPSTICK
Blood, UA: NEGATIVE
Nitrite, UA: NEGATIVE

## 2012-07-24 LAB — CBC
Hemoglobin: 11.1 g/dL — ABNORMAL LOW (ref 12.0–15.0)
MCH: 26.1 pg (ref 26.0–34.0)
MCV: 79.8 fL (ref 78.0–100.0)
Platelets: 301 10*3/uL (ref 150–400)
RBC: 4.26 MIL/uL (ref 3.87–5.11)
WBC: 14.7 10*3/uL — ABNORMAL HIGH (ref 4.0–10.5)

## 2012-07-24 MED ORDER — METHYLDOPA 500 MG PO TABS: 500.0000 mg | ORAL_TABLET | Freq: Two times a day (BID) | ORAL | Status: DC

## 2012-07-24 MED ORDER — NITROFURANTOIN MONOHYD MACRO 100 MG PO CAPS: 100.0000 mg | ORAL_CAPSULE | Freq: Two times a day (BID) | ORAL | Status: DC

## 2012-07-24 NOTE — Progress Notes (Signed)
U/S(28+2wks)-active fetus, approp growth EFW 2 lb 9 oz (48th%tile), fluid WNL,ant gr 1 plac, vtx, female fetus "Santina Evans"

## 2012-07-24 NOTE — Progress Notes (Signed)
HAVING BACK PAIN.

## 2012-07-24 NOTE — Patient Instructions (Signed)

## 2012-07-24 NOTE — Progress Notes (Signed)
Reports good fm. Denies uc's, lof, vb, urinary frequency, hesitancy, or dysuria.  Has had some urgency and pressure x 1 week. Rx Macrobid. Discussed norvasc w/ LHE- will switch to Aldomet 500mg  BID. Reports LBP- reviewed relief measures, again recommended pregnancy/maternity belt. Reviewed u/s results from today, ptl s/s, fetal kick counts.  All questions answered. PN2 today. F/U in 4wks for vist and begin 2x/wk NSTs.

## 2012-07-25 LAB — HIV ANTIBODY (ROUTINE TESTING W REFLEX): HIV: NONREACTIVE

## 2012-07-25 LAB — GLUCOSE TOLERANCE, 2 HOURS W/ 1HR
Glucose, 1 hour: 120 mg/dL (ref 70–170)
Glucose, 2 hour: 97 mg/dL (ref 70–139)
Glucose, Fasting: 73 mg/dL (ref 70–99)

## 2012-07-25 LAB — URINALYSIS
Bilirubin Urine: NEGATIVE
Hgb urine dipstick: NEGATIVE
Ketones, ur: NEGATIVE mg/dL
Specific Gravity, Urine: 1.023 (ref 1.005–1.030)
Urobilinogen, UA: 0.2 mg/dL (ref 0.0–1.0)

## 2012-07-25 LAB — URINE CULTURE: Organism ID, Bacteria: NO GROWTH

## 2012-07-25 LAB — ANTIBODY SCREEN: Antibody Screen: NEGATIVE

## 2012-07-27 ENCOUNTER — Encounter: Payer: Self-pay | Admitting: Women's Health

## 2012-08-21 ENCOUNTER — Encounter: Payer: Self-pay | Admitting: Obstetrics & Gynecology

## 2012-08-21 ENCOUNTER — Ambulatory Visit (INDEPENDENT_AMBULATORY_CARE_PROVIDER_SITE_OTHER): Payer: 59 | Admitting: Obstetrics & Gynecology

## 2012-08-21 VITALS — BP 110/70 | Wt 246.0 lb

## 2012-08-21 DIAGNOSIS — Z331 Pregnant state, incidental: Secondary | ICD-10-CM

## 2012-08-21 DIAGNOSIS — E669 Obesity, unspecified: Secondary | ICD-10-CM

## 2012-08-21 DIAGNOSIS — Z1389 Encounter for screening for other disorder: Secondary | ICD-10-CM

## 2012-08-21 DIAGNOSIS — O09529 Supervision of elderly multigravida, unspecified trimester: Secondary | ICD-10-CM

## 2012-08-21 DIAGNOSIS — O10013 Pre-existing essential hypertension complicating pregnancy, third trimester: Secondary | ICD-10-CM

## 2012-08-21 DIAGNOSIS — O10019 Pre-existing essential hypertension complicating pregnancy, unspecified trimester: Secondary | ICD-10-CM

## 2012-08-21 DIAGNOSIS — O99019 Anemia complicating pregnancy, unspecified trimester: Secondary | ICD-10-CM

## 2012-08-21 DIAGNOSIS — O99210 Obesity complicating pregnancy, unspecified trimester: Secondary | ICD-10-CM

## 2012-08-21 LAB — POCT URINALYSIS DIPSTICK
Blood, UA: NEGATIVE
Glucose, UA: NEGATIVE
Ketones, UA: NEGATIVE
Nitrite, UA: NEGATIVE

## 2012-08-21 NOTE — Progress Notes (Signed)
Having pelvic pressure.

## 2012-08-21 NOTE — Addendum Note (Signed)
Addended by: Lazaro Arms on: 08/21/2012 10:12 AM   Modules accepted: Orders

## 2012-08-21 NOTE — Patient Instructions (Addendum)

## 2012-08-21 NOTE — Progress Notes (Signed)
BP weight and urine results all reviewed and noted. Patient reports good fetal movement, denies any bleeding and no rupture of membranes symptoms or regular contractions. Patient is without complaints. All questions were answered.  

## 2012-08-24 ENCOUNTER — Encounter: Payer: Self-pay | Admitting: Obstetrics & Gynecology

## 2012-08-24 ENCOUNTER — Ambulatory Visit (INDEPENDENT_AMBULATORY_CARE_PROVIDER_SITE_OTHER): Payer: 59 | Admitting: Obstetrics & Gynecology

## 2012-08-24 ENCOUNTER — Ambulatory Visit (INDEPENDENT_AMBULATORY_CARE_PROVIDER_SITE_OTHER): Payer: 59

## 2012-08-24 ENCOUNTER — Other Ambulatory Visit: Payer: Self-pay | Admitting: Obstetrics & Gynecology

## 2012-08-24 ENCOUNTER — Other Ambulatory Visit: Payer: 59 | Admitting: Obstetrics & Gynecology

## 2012-08-24 VITALS — BP 130/80 | Wt 245.0 lb

## 2012-08-24 DIAGNOSIS — O10019 Pre-existing essential hypertension complicating pregnancy, unspecified trimester: Secondary | ICD-10-CM

## 2012-08-24 DIAGNOSIS — Z1389 Encounter for screening for other disorder: Secondary | ICD-10-CM

## 2012-08-24 DIAGNOSIS — Z331 Pregnant state, incidental: Secondary | ICD-10-CM

## 2012-08-24 DIAGNOSIS — O10013 Pre-existing essential hypertension complicating pregnancy, third trimester: Secondary | ICD-10-CM

## 2012-08-24 DIAGNOSIS — O09529 Supervision of elderly multigravida, unspecified trimester: Secondary | ICD-10-CM

## 2012-08-24 DIAGNOSIS — O99210 Obesity complicating pregnancy, unspecified trimester: Secondary | ICD-10-CM

## 2012-08-24 DIAGNOSIS — O09523 Supervision of elderly multigravida, third trimester: Secondary | ICD-10-CM

## 2012-08-24 DIAGNOSIS — O99019 Anemia complicating pregnancy, unspecified trimester: Secondary | ICD-10-CM

## 2012-08-24 DIAGNOSIS — E669 Obesity, unspecified: Secondary | ICD-10-CM

## 2012-08-24 LAB — POCT URINALYSIS DIPSTICK
Glucose, UA: NEGATIVE
Nitrite, UA: NEGATIVE

## 2012-08-24 NOTE — Progress Notes (Signed)
FOLLOW-UP U/S. 

## 2012-08-24 NOTE — Progress Notes (Signed)
Sonogram growth BPP and Doppler flow excellent BP weight and urine results all reviewed and noted. Patient reports good fetal movement, denies any bleeding and no rupture of membranes symptoms or regular contractions. Patient is without complaints. All questions were answered. Continue twice weekly testing

## 2012-08-24 NOTE — Patient Instructions (Addendum)
Fetal Movement Counts Patient Name: __________________________________________________ Patient Due Date: ____________________ Performing a fetal movement count is highly recommended in high-risk pregnancies, but it is good for every pregnant woman to do. Your caregiver may ask you to start counting fetal movements at 28 weeks of the pregnancy. Fetal movements often increase:  After eating a full meal.  After physical activity.  After eating or drinking something sweet or cold.  At rest. Pay attention to when you feel the baby is most active. This will help you notice a pattern of your baby's sleep and wake cycles and what factors contribute to an increase in fetal movement. It is important to perform a fetal movement count at the same time each day when your baby is normally most active.  HOW TO COUNT FETAL MOVEMENTS 1. Find a quiet and comfortable area to sit or lie down on your left side. Lying on your left side provides the best blood and oxygen circulation to your baby. 2. Write down the day and time on a sheet of paper or in a journal. 3. Start counting kicks, flutters, swishes, rolls, or jabs in a 2 hour period. You should feel at least 10 movements within 2 hours. 4. If you do not feel 10 movements in 2 hours, wait 2 3 hours and count again. Look for a change in the pattern or not enough counts in 2 hours. SEEK MEDICAL CARE IF:  You feel less than 10 counts in 2 hours, tried twice.  There is no movement in over an hour.  The pattern is changing or taking longer each day to reach 10 counts in 2 hours.  You feel the baby is not moving as he or she usually does. Date: ____________ Movements: ____________ Start time: ____________ Finish time: ____________  Date: ____________ Movements: ____________ Start time: ____________ Finish time: ____________ Date: ____________ Movements: ____________ Start time: ____________ Finish time: ____________ Date: ____________ Movements: ____________  Start time: ____________ Finish time: ____________ Date: ____________ Movements: ____________ Start time: ____________ Finish time: ____________ Date: ____________ Movements: ____________ Start time: ____________ Finish time: ____________ Date: ____________ Movements: ____________ Start time: ____________ Finish time: ____________ Date: ____________ Movements: ____________ Start time: ____________ Finish time: ____________  Date: ____________ Movements: ____________ Start time: ____________ Finish time: ____________ Date: ____________ Movements: ____________ Start time: ____________ Finish time: ____________ Date: ____________ Movements: ____________ Start time: ____________ Finish time: ____________ Date: ____________ Movements: ____________ Start time: ____________ Finish time: ____________ Date: ____________ Movements: ____________ Start time: ____________ Finish time: ____________ Date: ____________ Movements: ____________ Start time: ____________ Finish time: ____________ Date: ____________ Movements: ____________ Start time: ____________ Finish time: ____________  Date: ____________ Movements: ____________ Start time: ____________ Finish time: ____________ Date: ____________ Movements: ____________ Start time: ____________ Finish time: ____________ Date: ____________ Movements: ____________ Start time: ____________ Finish time: ____________ Date: ____________ Movements: ____________ Start time: ____________ Finish time: ____________ Date: ____________ Movements: ____________ Start time: ____________ Finish time: ____________ Date: ____________ Movements: ____________ Start time: ____________ Finish time: ____________ Date: ____________ Movements: ____________ Start time: ____________ Finish time: ____________  Date: ____________ Movements: ____________ Start time: ____________ Finish time: ____________ Date: ____________ Movements: ____________ Start time: ____________ Finish time:  ____________ Date: ____________ Movements: ____________ Start time: ____________ Finish time: ____________ Date: ____________ Movements: ____________ Start time: ____________ Finish time: ____________ Date: ____________ Movements: ____________ Start time: ____________ Finish time: ____________ Date: ____________ Movements: ____________ Start time: ____________ Finish time: ____________ Date: ____________ Movements: ____________ Start time: ____________ Finish time: ____________  Date: ____________ Movements: ____________ Start time: ____________ Finish   time: ____________ Date: ____________ Movements: ____________ Start time: ____________ Finish time: ____________ Date: ____________ Movements: ____________ Start time: ____________ Finish time: ____________ Date: ____________ Movements: ____________ Start time: ____________ Finish time: ____________ Date: ____________ Movements: ____________ Start time: ____________ Finish time: ____________ Date: ____________ Movements: ____________ Start time: ____________ Finish time: ____________ Date: ____________ Movements: ____________ Start time: ____________ Finish time: ____________  Date: ____________ Movements: ____________ Start time: ____________ Finish time: ____________ Date: ____________ Movements: ____________ Start time: ____________ Finish time: ____________ Date: ____________ Movements: ____________ Start time: ____________ Finish time: ____________ Date: ____________ Movements: ____________ Start time: ____________ Finish time: ____________ Date: ____________ Movements: ____________ Start time: ____________ Finish time: ____________ Date: ____________ Movements: ____________ Start time: ____________ Finish time: ____________ Date: ____________ Movements: ____________ Start time: ____________ Finish time: ____________  Date: ____________ Movements: ____________ Start time: ____________ Finish time: ____________ Date: ____________ Movements:  ____________ Start time: ____________ Finish time: ____________ Date: ____________ Movements: ____________ Start time: ____________ Finish time: ____________ Date: ____________ Movements: ____________ Start time: ____________ Finish time: ____________ Date: ____________ Movements: ____________ Start time: ____________ Finish time: ____________ Date: ____________ Movements: ____________ Start time: ____________ Finish time: ____________ Date: ____________ Movements: ____________ Start time: ____________ Finish time: ____________  Date: ____________ Movements: ____________ Start time: ____________ Finish time: ____________ Date: ____________ Movements: ____________ Start time: ____________ Finish time: ____________ Date: ____________ Movements: ____________ Start time: ____________ Finish time: ____________ Date: ____________ Movements: ____________ Start time: ____________ Finish time: ____________ Date: ____________ Movements: ____________ Start time: ____________ Finish time: ____________ Date: ____________ Movements: ____________ Start time: ____________ Finish time: ____________ Document Released: 02/03/2006 Document Revised: 12/22/2011 Document Reviewed: 11/01/2011 [employer] Patient Information 2014 [employer]  

## 2012-08-24 NOTE — Progress Notes (Signed)
U/S(32+5wks)-vtx active fetus BPP 8/8, approp growth EFW 4 lb 14 oz (59th%tile), fluid wnl AFI-9.1cm, UA Doppler RI 0.57 & 0.65, female fetus "Audrey Coleman"

## 2012-08-28 ENCOUNTER — Ambulatory Visit (INDEPENDENT_AMBULATORY_CARE_PROVIDER_SITE_OTHER): Payer: 59 | Admitting: Obstetrics & Gynecology

## 2012-08-28 ENCOUNTER — Encounter: Payer: Self-pay | Admitting: Obstetrics & Gynecology

## 2012-08-28 VITALS — BP 110/80 | Wt 244.0 lb

## 2012-08-28 DIAGNOSIS — O0993 Supervision of high risk pregnancy, unspecified, third trimester: Secondary | ICD-10-CM

## 2012-08-28 DIAGNOSIS — O10013 Pre-existing essential hypertension complicating pregnancy, third trimester: Secondary | ICD-10-CM

## 2012-08-28 DIAGNOSIS — O10019 Pre-existing essential hypertension complicating pregnancy, unspecified trimester: Secondary | ICD-10-CM

## 2012-08-28 DIAGNOSIS — Z1389 Encounter for screening for other disorder: Secondary | ICD-10-CM

## 2012-08-28 DIAGNOSIS — Z331 Pregnant state, incidental: Secondary | ICD-10-CM

## 2012-08-28 LAB — POCT URINALYSIS DIPSTICK
Glucose, UA: NEGATIVE
Ketones, UA: NEGATIVE
Protein, UA: NEGATIVE

## 2012-08-28 NOTE — Patient Instructions (Signed)
Epidural Risks and Benefits The continuous putting in (infusion) of local anesthetics through a long, narrow, hollow plastic tube (catheter)/needle into the lower (lumbar) area of your spine is commonly called an epidural. This means outside the covering of the spinal cord. The epidural catheter is placed in the space on the outside of the membrane that covers the spinal cord. The anesthetic medicine numbs the nerves of the spinal cord in the epidural space. There is also a spinal/epidural anesthetic using two needles and a catheter. The medication is first placed in the spinal canal. Then that needle is removed and a catheter is placed in the epidural space through the second needle for continuous anesthesia. This seems to be the most popular type of regional anesthesia used now. This is sometimes given for pain management to women who are giving birth. Spinal and epidural anesthesia are called regional anesthesia because they numb a certain region of the body. While it is an effective pain management tool, some reasons not to use this include:  Restricted mobility: The tubes and monitors connected to you do not allow for much moving around.  Increased likelihood of bladder catheterization, oxytocin administration, and internal monitoring. This means a tube (catheter) may have to be put into the bladder to drain the urine. Uterine contractions can become weaker and less frequent. They also may have a higher use of oxytocin than mothers not having regional anesthesia.  Increased likelihood of operative delivery: This includes the use of or need for forceps, vacuum extractor, episiotomy, or cesarean delivery. When the dose is too large, or when it sinks down into the "tailbone" (sacral) region of the body, the perineum and the birth canal (vagina) are anesthetized. Anesthetic is injected into this area late in labor to deaden all sensation. When it "accidentally" happens earlier in labor, the muscles of the  pelvic floor are relaxed too early. This interferes with the normal flexion and rotation of the baby's head as it passes through the birth canal. This interference can lead to abnormal presentations that are more dangerous for the baby.  Must use an automatic blood pressure cuff throughout labor. This is a cuff that automatically takes your blood pressure at regular intervals. SHORT TERM MATERNAL RISKS  Dural puncture - The dura is one of the membranes surrounding the spinal cord. If the anesthetic medication gets into the spinal canal through a dural puncture, it can result in a spinal anesthetic and spinal headache. Spinal headaches are treated with an epidural blood patch to cover the punctured area.  Low blood pressure (hypotension) - Nearly one third of women with an epidural will develop low blood pressure. The ways that patients must lay during the epidural can make this worse. Their position is limited because they will be unable to move their legs easily for the time of the anesthetic. Low blood pressure is also a risk for the baby. If the baby does not get enough oxygen from the mom's blood, it can result in an emergency Cesarean section. This means the baby is delivered by an operation through a cut by the surgeon (incision) on the belly of the mother.  Nausea, vomiting, and prolonged shivering.  Prolonged labor - With large doses of anesthetic medication, the patient loses the desire and the ability to bear down and push. This results in an increased use of forceps and vacuum extractions, compared to women having unmedicated deliveries.  Uneven, incomplete or non-existent pain relief. Sometimes the epidural does not work well and   additional medications may be needed for pain relief.  Difficulty breathing well or paralysis if the level of anesthesia goes too high in the spine.  Convulsions - If the anesthetic agent accidentally is injected into a blood vessel it can cause convulsions and  loss of consciousness.  Toxic drug reactions.  Septic meningitis - An abscess can form at the site where the epidural catheter is placed. If this spreads into the spinal canal it can cause meningitis.  Allergic reaction - This causes blood pressure to become too low and other medications and fluids must be given to bring the blood pressure up. Also rashes and difficulty breathing may develop.  Cardiac arrest - This is rare but real threat to the life of the mother and baby.  Fever is common.  Itching that is easily treated.  Spinal hematoma. LONG TERM MATERNAL RISKS  Neurological complications - A nerve problem called Horner's syndrome can develop with epidural anesthesia for vaginal delivery. It is impossible to predict which patients will develop a Horner's syndrome. Even the nerves to the face can be blocked, temporarily or permanently. Tremors and shakes can occur.  Paresthesia ("pins and needles"). This is a feeling that comes from inflammation of a nerve.  Dizziness and fainting can become a problem after epidurals. This is usually only for a couple of days. RISKS TO BABY  Direct drug toxicity.  Fetal distress, abnormal fetal heart rate (FHR) (can lead to emergency cesarean). This is especially true if the anesthetic gets into the mother's blood stream or too much medication is put into the epidural. REASONS NOT TO HAVE EPIDURAL ANESTHESIA  Increased costs.  The mother has a low blood pressure.  There are blood clotting problems.  A brain tumor is present.  There is an infection in the blood stream.  A skin infection at the needle site.  A tattoo at the needle site. BENEFITS  Regional anesthesia is the most effective pain relief for labor and delivery.  It is the best anesthetic for preeclampsia and eclampsia.  There is better pain control after delivery (vaginal or cesarean).  When done correctly, no medication gets to the baby.  Sooner ambulation after  delivery.  It can be left in place during all of labor.  You can be awake during a Cesarean delivery and see the baby immediately after delivery. AFTER THE PROCEDURE   You will be kept in bed for several hours to prevent headaches.  You will be kept in bed until your legs are no longer numb and it is safe to walk.  The length of time you spend in the hospital will depend on the type of surgery or procedure you have had.  The epidural catheter is removed after you no longer need it for pain. HOME CARE INSTRUCTIONS   Do not drive or operate any kind of machinery for at least 24 hours. Make sure there is someone to drive you home.  Do not drink alcohol for at least 24 hours after the anesthesia.  Do not make important decisions for at least 24 hours after the anesthesia.  Drink lots of fluids.  Return to your normal diet.  Keep all your postoperative appointments as scheduled. SEEK IMMEDIATE MEDICAL CARE IF:  You develop a fever or temperature over 98.6 F (37 C).  You have a persistent headache.  You develop dizziness, fainting or lightheadedness.  You develop weakness, numbness or tingling in your arms or legs.  You have a skin rash.  You   have difficulty breathing  You have a stiff neck with or without stiff back.  You develop chest pain. Document Released: 01/04/2005 Document Revised: 03/29/2011 Document Reviewed: 02/12/2008 Exit[employer] Patient Information 2014 [employer].  

## 2012-08-28 NOTE — Progress Notes (Signed)
FOR NST TODAY. 

## 2012-08-28 NOTE — Progress Notes (Signed)
Reactive NST, sonogram set for 8/14 BP weight and urine results all reviewed and noted. Patient reports good fetal movement, denies any bleeding and no rupture of membranes symptoms or regular contractions. Patient is without complaints. All questions were answered.

## 2012-08-31 ENCOUNTER — Other Ambulatory Visit: Payer: Self-pay | Admitting: Obstetrics & Gynecology

## 2012-08-31 ENCOUNTER — Ambulatory Visit (INDEPENDENT_AMBULATORY_CARE_PROVIDER_SITE_OTHER): Payer: 59

## 2012-08-31 ENCOUNTER — Ambulatory Visit (INDEPENDENT_AMBULATORY_CARE_PROVIDER_SITE_OTHER): Payer: 59 | Admitting: Advanced Practice Midwife

## 2012-08-31 VITALS — BP 112/76 | Wt 247.0 lb

## 2012-08-31 DIAGNOSIS — O10019 Pre-existing essential hypertension complicating pregnancy, unspecified trimester: Secondary | ICD-10-CM

## 2012-08-31 DIAGNOSIS — O10913 Unspecified pre-existing hypertension complicating pregnancy, third trimester: Secondary | ICD-10-CM

## 2012-08-31 DIAGNOSIS — Z1389 Encounter for screening for other disorder: Secondary | ICD-10-CM

## 2012-08-31 DIAGNOSIS — O10013 Pre-existing essential hypertension complicating pregnancy, third trimester: Secondary | ICD-10-CM

## 2012-08-31 DIAGNOSIS — O163 Unspecified maternal hypertension, third trimester: Secondary | ICD-10-CM

## 2012-08-31 LAB — POCT URINALYSIS DIPSTICK
Blood, UA: NEGATIVE
Ketones, UA: NEGATIVE
Leukocytes, UA: NEGATIVE

## 2012-08-31 NOTE — Progress Notes (Signed)
Pt here today for routine visit and [place]. PT states she is still experiencing some pressure. Pt denies any other problems or concerns at this time.

## 2012-08-31 NOTE — Progress Notes (Signed)
Had u/s today.  No c/o other than pressure.    Routine questions about pregnancy answered.  F/U in monday weeks for NST.

## 2012-08-31 NOTE — Progress Notes (Signed)
U/S(33+5wks)-active vtx fetus, fluid WNL AFI-8.8cm, ant gr 1 placenta, UA Doppler RI=0.64 and 0.70

## 2012-09-04 ENCOUNTER — Ambulatory Visit (INDEPENDENT_AMBULATORY_CARE_PROVIDER_SITE_OTHER): Payer: 59 | Admitting: Obstetrics and Gynecology

## 2012-09-04 VITALS — BP 106/70 | Wt 246.4 lb

## 2012-09-04 DIAGNOSIS — Z1389 Encounter for screening for other disorder: Secondary | ICD-10-CM

## 2012-09-04 DIAGNOSIS — O10019 Pre-existing essential hypertension complicating pregnancy, unspecified trimester: Secondary | ICD-10-CM

## 2012-09-04 DIAGNOSIS — O09529 Supervision of elderly multigravida, unspecified trimester: Secondary | ICD-10-CM

## 2012-09-04 DIAGNOSIS — Z331 Pregnant state, incidental: Secondary | ICD-10-CM

## 2012-09-04 DIAGNOSIS — O99019 Anemia complicating pregnancy, unspecified trimester: Secondary | ICD-10-CM

## 2012-09-04 DIAGNOSIS — E669 Obesity, unspecified: Secondary | ICD-10-CM

## 2012-09-04 LAB — POCT URINALYSIS DIPSTICK
Blood, UA: NEGATIVE
Glucose, UA: NEGATIVE
Nitrite, UA: NEGATIVE

## 2012-09-04 NOTE — Progress Notes (Signed)
C/o "a lot of cramping" NST reactive after tactile stimulation. No complaints of ha, scotoma.  Repeat u/s Thursday, NST Q MONDAY

## 2012-09-04 NOTE — Addendum Note (Signed)
Addended by: Criss Alvine on: 09/04/2012 02:32 PM   Modules accepted: Orders

## 2012-09-05 ENCOUNTER — Other Ambulatory Visit: Payer: Self-pay | Admitting: Obstetrics & Gynecology

## 2012-09-05 DIAGNOSIS — O09523 Supervision of elderly multigravida, third trimester: Secondary | ICD-10-CM

## 2012-09-05 DIAGNOSIS — O10013 Pre-existing essential hypertension complicating pregnancy, third trimester: Secondary | ICD-10-CM

## 2012-09-07 ENCOUNTER — Ambulatory Visit (INDEPENDENT_AMBULATORY_CARE_PROVIDER_SITE_OTHER): Payer: 59

## 2012-09-07 ENCOUNTER — Ambulatory Visit (INDEPENDENT_AMBULATORY_CARE_PROVIDER_SITE_OTHER): Payer: 59 | Admitting: Advanced Practice Midwife

## 2012-09-07 ENCOUNTER — Encounter: Payer: Self-pay | Admitting: Advanced Practice Midwife

## 2012-09-07 VITALS — BP 118/76 | Wt 246.8 lb

## 2012-09-07 DIAGNOSIS — O10013 Pre-existing essential hypertension complicating pregnancy, third trimester: Secondary | ICD-10-CM

## 2012-09-07 DIAGNOSIS — O09529 Supervision of elderly multigravida, unspecified trimester: Secondary | ICD-10-CM

## 2012-09-07 DIAGNOSIS — Z029 Encounter for administrative examinations, unspecified: Secondary | ICD-10-CM

## 2012-09-07 DIAGNOSIS — Z331 Pregnant state, incidental: Secondary | ICD-10-CM

## 2012-09-07 DIAGNOSIS — O09523 Supervision of elderly multigravida, third trimester: Secondary | ICD-10-CM

## 2012-09-07 DIAGNOSIS — O10019 Pre-existing essential hypertension complicating pregnancy, unspecified trimester: Secondary | ICD-10-CM

## 2012-09-07 DIAGNOSIS — Z1389 Encounter for screening for other disorder: Secondary | ICD-10-CM

## 2012-09-07 LAB — POCT URINALYSIS DIPSTICK: Glucose, UA: NEGATIVE

## 2012-09-07 MED ORDER — FLUCONAZOLE 150 MG PO TABS: 150.0000 mg | ORAL_TABLET | Freq: Once | ORAL | Status: DC

## 2012-09-07 NOTE — Patient Instructions (Signed)

## 2012-09-07 NOTE — Progress Notes (Signed)
C/O Angelini vag d/c w/o odor. SSE: yeast.  Diflucan q 3 days X2..back pain handout given. Routine questions about pregnancy answered.  F/U in Monday for NST. \

## 2012-09-07 NOTE — Progress Notes (Signed)
Has back pain at night when she lays down. Has a Brannigan vaginal discharge with no odor.

## 2012-09-07 NOTE — Progress Notes (Signed)
U/S(34+5wks)- vtx active fetus BPP 8/8, fluid wnl AFI-10.1cm, ant gr 1 plac, EFW 5 lb 4 oz (37th%tile), UA Doppler RI-0.55 & 0.61, female fetus

## 2012-09-08 ENCOUNTER — Other Ambulatory Visit: Payer: Self-pay | Admitting: Obstetrics & Gynecology

## 2012-09-08 DIAGNOSIS — O10013 Pre-existing essential hypertension complicating pregnancy, third trimester: Secondary | ICD-10-CM

## 2012-09-11 ENCOUNTER — Ambulatory Visit (INDEPENDENT_AMBULATORY_CARE_PROVIDER_SITE_OTHER): Payer: 59 | Admitting: Women's Health

## 2012-09-11 ENCOUNTER — Encounter: Payer: Self-pay | Admitting: Women's Health

## 2012-09-11 VITALS — BP 110/62 | Wt 249.0 lb

## 2012-09-11 DIAGNOSIS — O0993 Supervision of high risk pregnancy, unspecified, third trimester: Secondary | ICD-10-CM

## 2012-09-11 DIAGNOSIS — O10913 Unspecified pre-existing hypertension complicating pregnancy, third trimester: Secondary | ICD-10-CM

## 2012-09-11 DIAGNOSIS — O10019 Pre-existing essential hypertension complicating pregnancy, unspecified trimester: Secondary | ICD-10-CM

## 2012-09-11 DIAGNOSIS — Z1389 Encounter for screening for other disorder: Secondary | ICD-10-CM

## 2012-09-11 DIAGNOSIS — O09523 Supervision of elderly multigravida, third trimester: Secondary | ICD-10-CM

## 2012-09-11 LAB — POCT URINALYSIS DIPSTICK
Ketones, UA: NEGATIVE
Leukocytes, UA: NEGATIVE
Nitrite, UA: NEGATIVE
Protein, UA: NEGATIVE

## 2012-09-11 NOTE — Progress Notes (Signed)
Pt here today for routine visit and NST. Pt denies any problems or concerns at this time.  

## 2012-09-11 NOTE — Progress Notes (Signed)
Reports good fm. Denies uc's, lof, vb, urinary frequency, urgency, hesitancy, or dysuria.  No complaints.  NST reactive. Reviewed ptl s/s, FKC.  All questions answered. F/U on Thurs for u/s bpp/dopp and visit.

## 2012-09-11 NOTE — Patient Instructions (Addendum)

## 2012-09-12 ENCOUNTER — Telehealth: Payer: Self-pay | Admitting: Obstetrics & Gynecology

## 2012-09-12 NOTE — Telephone Encounter (Signed)
C/o diarrhea, no fever, really bad cramping this am. Pt informed can take OTC Imodium for diarrhea, push fluids. If no improvement call office back. Pt verbalized understanding.

## 2012-09-14 ENCOUNTER — Encounter: Payer: Self-pay | Admitting: Obstetrics & Gynecology

## 2012-09-14 ENCOUNTER — Ambulatory Visit (INDEPENDENT_AMBULATORY_CARE_PROVIDER_SITE_OTHER): Payer: 59

## 2012-09-14 ENCOUNTER — Ambulatory Visit (INDEPENDENT_AMBULATORY_CARE_PROVIDER_SITE_OTHER): Payer: 59 | Admitting: Obstetrics & Gynecology

## 2012-09-14 VITALS — BP 110/70 | Wt 246.0 lb

## 2012-09-14 DIAGNOSIS — O10019 Pre-existing essential hypertension complicating pregnancy, unspecified trimester: Secondary | ICD-10-CM

## 2012-09-14 DIAGNOSIS — O10013 Pre-existing essential hypertension complicating pregnancy, third trimester: Secondary | ICD-10-CM

## 2012-09-14 DIAGNOSIS — Z331 Pregnant state, incidental: Secondary | ICD-10-CM

## 2012-09-14 DIAGNOSIS — Z1389 Encounter for screening for other disorder: Secondary | ICD-10-CM

## 2012-09-14 LAB — POCT URINALYSIS DIPSTICK
Blood, UA: NEGATIVE
Leukocytes, UA: NEGATIVE
Nitrite, UA: NEGATIVE
Protein, UA: NEGATIVE

## 2012-09-14 NOTE — Addendum Note (Signed)
Addended by: Laresha Bacorn G on: 09/14/2012 02:23 PM   Modules accepted: Orders  

## 2012-09-14 NOTE — Progress Notes (Signed)
U/S(35+5wks)-vtx active fetus, Fluid WNL, AFI=8.5cm, BPP 8/8, UA Doppler RI=0.66 & 0.61, female fetus, "Audrey Coleman"

## 2012-09-14 NOTE — Progress Notes (Signed)
Excellent sonogram evaluation.   BP weight and urine results all reviewed and noted. Patient reports good fetal movement, denies any bleeding and no rupture of membranes symptoms or regular contractions. Patient is without complaints. All questions were answered.

## 2012-09-14 NOTE — Progress Notes (Signed)
Had U/S Today. 

## 2012-09-19 ENCOUNTER — Encounter: Payer: Self-pay | Admitting: Obstetrics & Gynecology

## 2012-09-19 ENCOUNTER — Ambulatory Visit (INDEPENDENT_AMBULATORY_CARE_PROVIDER_SITE_OTHER): Payer: 59 | Admitting: Obstetrics & Gynecology

## 2012-09-19 VITALS — BP 110/70 | Wt 247.0 lb

## 2012-09-19 DIAGNOSIS — O10019 Pre-existing essential hypertension complicating pregnancy, unspecified trimester: Secondary | ICD-10-CM

## 2012-09-19 DIAGNOSIS — Z1389 Encounter for screening for other disorder: Secondary | ICD-10-CM

## 2012-09-19 DIAGNOSIS — Z331 Pregnant state, incidental: Secondary | ICD-10-CM

## 2012-09-19 DIAGNOSIS — O99019 Anemia complicating pregnancy, unspecified trimester: Secondary | ICD-10-CM

## 2012-09-19 LAB — POCT URINALYSIS DIPSTICK
Glucose, UA: NEGATIVE
Leukocytes, UA: NEGATIVE
Nitrite, UA: NEGATIVE
Protein, UA: NEGATIVE

## 2012-09-19 NOTE — Progress Notes (Signed)
Reactive NST BP weight and urine results all reviewed and noted. Patient reports good fetal movement, denies any bleeding and no rupture of membranes symptoms or regular contractions. Patient is without complaints. All questions were answered.  

## 2012-09-19 NOTE — Progress Notes (Signed)
For NST

## 2012-09-21 ENCOUNTER — Other Ambulatory Visit: Payer: Self-pay | Admitting: Obstetrics & Gynecology

## 2012-09-21 DIAGNOSIS — O10013 Pre-existing essential hypertension complicating pregnancy, third trimester: Secondary | ICD-10-CM

## 2012-09-22 ENCOUNTER — Ambulatory Visit (INDEPENDENT_AMBULATORY_CARE_PROVIDER_SITE_OTHER): Payer: 59

## 2012-09-22 ENCOUNTER — Ambulatory Visit (INDEPENDENT_AMBULATORY_CARE_PROVIDER_SITE_OTHER): Payer: Self-pay | Admitting: Women's Health

## 2012-09-22 ENCOUNTER — Encounter: Payer: Self-pay | Admitting: Women's Health

## 2012-09-22 VITALS — BP 110/72 | Wt 250.5 lb

## 2012-09-22 DIAGNOSIS — O10013 Pre-existing essential hypertension complicating pregnancy, third trimester: Secondary | ICD-10-CM

## 2012-09-22 DIAGNOSIS — O09523 Supervision of elderly multigravida, third trimester: Secondary | ICD-10-CM

## 2012-09-22 DIAGNOSIS — Z331 Pregnant state, incidental: Secondary | ICD-10-CM

## 2012-09-22 DIAGNOSIS — O10019 Pre-existing essential hypertension complicating pregnancy, unspecified trimester: Secondary | ICD-10-CM

## 2012-09-22 DIAGNOSIS — Z1389 Encounter for screening for other disorder: Secondary | ICD-10-CM

## 2012-09-22 DIAGNOSIS — O99019 Anemia complicating pregnancy, unspecified trimester: Secondary | ICD-10-CM

## 2012-09-22 DIAGNOSIS — O0993 Supervision of high risk pregnancy, unspecified, third trimester: Secondary | ICD-10-CM

## 2012-09-22 LAB — POCT URINALYSIS DIPSTICK
Ketones, UA: NEGATIVE
Leukocytes, UA: NEGATIVE
Protein, UA: NEGATIVE

## 2012-09-22 LAB — OB RESULTS CONSOLE GC/CHLAMYDIA
Chlamydia: NEGATIVE
Gonorrhea: NEGATIVE

## 2012-09-22 NOTE — Progress Notes (Signed)
U/S(36+6wks)-vtx active fetus BPP 8/8, fluid wnl AFI-8.3cm, ant gr 2 plac, female fetus, approp growth EFW 6 lb 6 oz (39th%tile), UA Doppler RI-0.58 x 2

## 2012-09-22 NOTE — Progress Notes (Signed)
Reports good fm. Denies uc's, lof, vb, urinary frequency, urgency, hesitancy, or dysuria.  No complaints.  Reviewed u/s results, labor s/s, fkc.  All questions answered. GBS, GC/CH today. F/U on Tues for nst and visit.

## 2012-09-22 NOTE — Patient Instructions (Addendum)
Call the office or go to [facility] if:  You begin to have strong, frequent contractions  Your water breaks.  Sometimes it is a big gush of fluid, sometimes it is just a trickle that keeps getting your panties wet or running down your legs  You have vaginal bleeding.  It is normal to have a small amount of spotting if your cervix was checked.   You don't feel your baby moving like normal.  If you don't, get you something to eat and drink and lay down and focus on feeling your baby move.  You should feel at least 10 movements in 2 hours.  If you don't, you should call the office or go to [facility].   Braxton Hicks Contractions Pregnancy is commonly associated with contractions of the uterus throughout the pregnancy. Towards the end of pregnancy (32 to 34 weeks), these contractions (Braxton Hicks) can develop more often and may become more forceful. This is not true labor because these contractions do not result in opening (dilatation) and thinning of the cervix. They are sometimes difficult to tell apart from true labor because these contractions can be forceful and people have different pain tolerances. You should not feel embarrassed if you go to the hospital with false labor. Sometimes, the only way to tell if you are in true labor is for your caregiver to follow the changes in the cervix. How to tell the difference between true and false labor:  False labor.  The contractions of false labor are usually shorter, irregular and not as hard as those of true labor.  They are often felt in the front of the lower abdomen and in the groin.  They may leave with walking around or changing positions while lying down.  They get weaker and are shorter lasting as time goes on.  These contractions are usually irregular.  They do not usually become progressively stronger, regular and closer together as with true labor.  True labor.  Contractions in true labor last 30 to 70 seconds,  become very regular, usually become more intense, and increase in frequency.  They do not go away with walking.  The discomfort is usually felt in the top of the uterus and spreads to the lower abdomen and low back.  True labor can be determined by your caregiver with an exam. This will show that the cervix is dilating and getting thinner. If there are no prenatal problems or other health problems associated with the pregnancy, it is completely safe to be sent home with false labor and await the onset of true labor. HOME CARE INSTRUCTIONS   Keep up with your usual exercises and instructions.  Take medications as directed.  Keep your regular prenatal appointment.  Eat and drink lightly if you think you are going into labor.  If BH contractions are making you uncomfortable:  Change your activity position from lying down or resting to walking/walking to resting.  Sit and rest in a tub of warm water.  Drink 2 to 3 glasses of water. Dehydration may cause B-H contractions.  Do slow and deep breathing several times an hour. SEEK IMMEDIATE MEDICAL CARE IF:   Your contractions continue to become stronger, more regular, and closer together.  You have a gushing, burst or leaking of fluid from the vagina.  An oral temperature above 102 F (38.9 C) develops.  You have passage of blood-tinged mucus.  You develop vaginal bleeding.  You develop continuous belly (abdominal) pain.  You have   low back pain that you never had before.  You feel the baby's head pushing down causing pelvic pressure.  The baby is not moving as much as it used to. Document Released: 01/04/2005 Document Revised: 03/29/2011 Document Reviewed: 06/28/2008 Exit[employer] Patient Information 2014 [employer].  

## 2012-09-24 LAB — STREP B DNA PROBE: GBSP: NEGATIVE

## 2012-09-25 ENCOUNTER — Encounter: Payer: Self-pay | Admitting: Women's Health

## 2012-09-26 ENCOUNTER — Ambulatory Visit (INDEPENDENT_AMBULATORY_CARE_PROVIDER_SITE_OTHER): Payer: 59 | Admitting: Obstetrics & Gynecology

## 2012-09-26 VITALS — BP 112/74 | Wt 250.0 lb

## 2012-09-26 DIAGNOSIS — O10019 Pre-existing essential hypertension complicating pregnancy, unspecified trimester: Secondary | ICD-10-CM

## 2012-09-26 DIAGNOSIS — Z1389 Encounter for screening for other disorder: Secondary | ICD-10-CM

## 2012-09-26 DIAGNOSIS — Z331 Pregnant state, incidental: Secondary | ICD-10-CM

## 2012-09-26 DIAGNOSIS — O09529 Supervision of elderly multigravida, unspecified trimester: Secondary | ICD-10-CM

## 2012-09-26 DIAGNOSIS — O99019 Anemia complicating pregnancy, unspecified trimester: Secondary | ICD-10-CM

## 2012-09-26 LAB — POCT URINALYSIS DIPSTICK
Leukocytes, UA: NEGATIVE
Nitrite, UA: NEGATIVE
Protein, UA: NEGATIVE

## 2012-09-26 NOTE — Progress Notes (Signed)
No complaints at this time. NST today. 

## 2012-09-26 NOTE — Progress Notes (Signed)
Reactive NST, do another one on Friday, sonogram next Tuesday, Induce at 39 weeks BP weight and urine results all reviewed and noted. Patient reports good fetal movement, denies any bleeding and no rupture of membranes symptoms or regular contractions. Patient is without complaints. All questions were answered.

## 2012-09-26 NOTE — Progress Notes (Signed)
For NST Today.

## 2012-09-28 ENCOUNTER — Telehealth: Payer: Self-pay | Admitting: *Deleted

## 2012-09-28 ENCOUNTER — Inpatient Hospital Stay (HOSPITAL_COMMUNITY)
Admission: AD | Admit: 2012-09-28 | Discharge: 2012-09-30 | DRG: 774 | Disposition: A | Discharge status: Home / Self Care | Payer: 59 | Source: Ambulatory Visit | Attending: Obstetrics & Gynecology | Admitting: Obstetrics & Gynecology

## 2012-09-28 ENCOUNTER — Encounter (HOSPITAL_COMMUNITY): Payer: Self-pay | Admitting: *Deleted

## 2012-09-28 DIAGNOSIS — O10013 Pre-existing essential hypertension complicating pregnancy, third trimester: Secondary | ICD-10-CM

## 2012-09-28 DIAGNOSIS — O09529 Supervision of elderly multigravida, unspecified trimester: Secondary | ICD-10-CM | POA: Diagnosis present

## 2012-09-28 DIAGNOSIS — O1002 Pre-existing essential hypertension complicating childbirth: Secondary | ICD-10-CM

## 2012-09-28 DIAGNOSIS — O09523 Supervision of elderly multigravida, third trimester: Secondary | ICD-10-CM

## 2012-09-28 LAB — CBC
HCT: 36.3 % (ref 36.0–46.0)
MCHC: 33.1 g/dL (ref 30.0–36.0)
RDW: 14.9 % (ref 11.5–15.5)
WBC: 13.7 10*3/uL — ABNORMAL HIGH (ref 4.0–10.5)

## 2012-09-28 LAB — RPR: RPR Ser Ql: NONREACTIVE

## 2012-09-28 MED ORDER — LACTATED RINGERS IV SOLN: INTRAVENOUS | Status: DC

## 2012-09-28 MED ORDER — ZOLPIDEM TARTRATE 5 MG PO TABS: 5.0000 mg | ORAL_TABLET | Freq: Every evening | ORAL | Status: DC | PRN

## 2012-09-28 MED ORDER — DIPHENHYDRAMINE HCL 25 MG PO CAPS: 25.0000 mg | ORAL_CAPSULE | Freq: Four times a day (QID) | ORAL | Status: DC | PRN

## 2012-09-28 MED ORDER — LACTATED RINGERS IV SOLN: 500.0000 mL | INTRAVENOUS | Status: DC | PRN

## 2012-09-28 MED ORDER — ACETAMINOPHEN 325 MG PO TABS: 650.0000 mg | ORAL_TABLET | ORAL | Status: DC | PRN

## 2012-09-28 MED ORDER — ONDANSETRON HCL 4 MG PO TABS: 4.0000 mg | ORAL_TABLET | ORAL | Status: DC | PRN

## 2012-09-28 MED ORDER — CITRIC ACID-SODIUM CITRATE 334-500 MG/5ML PO SOLN: 30.0000 mL | ORAL | Status: DC | PRN

## 2012-09-28 MED ORDER — FENTANYL BOLUS VIA INFUSION
100.0000 ug | Freq: Once | INTRAVENOUS | Status: DC
  Filled 2012-09-28: qty 100

## 2012-09-28 MED ORDER — FENTANYL CITRATE 0.05 MG/ML IJ SOLN
INTRAMUSCULAR | Status: AC
Start: 2012-09-28 — End: 2012-09-28
  Administered 2012-09-28: 100 ug
  Filled 2012-09-28: qty 2

## 2012-09-28 MED ORDER — TETANUS-DIPHTH-ACELL PERTUSSIS 5-2.5-18.5 LF-MCG/0.5 IM SUSP
0.5000 mL | Freq: Once | INTRAMUSCULAR | Status: AC
  Administered 2012-09-29: 0.5 mL via INTRAMUSCULAR

## 2012-09-28 MED ORDER — ONDANSETRON HCL 4 MG/2ML IJ SOLN: 4.0000 mg | INTRAMUSCULAR | Status: DC | PRN

## 2012-09-28 MED ORDER — OXYCODONE-ACETAMINOPHEN 5-325 MG PO TABS: 1.0000 | ORAL_TABLET | ORAL | Status: DC | PRN

## 2012-09-28 MED ORDER — LANOLIN HYDROUS EX OINT: TOPICAL_OINTMENT | CUTANEOUS | Status: DC | PRN

## 2012-09-28 MED ORDER — ONDANSETRON HCL 4 MG/2ML IJ SOLN: 4.0000 mg | Freq: Four times a day (QID) | INTRAMUSCULAR | Status: DC | PRN

## 2012-09-28 MED ORDER — SIMETHICONE 80 MG PO CHEW: 80.0000 mg | CHEWABLE_TABLET | ORAL | Status: DC | PRN

## 2012-09-28 MED ORDER — INFLUENZA VAC SPLIT QUAD 0.5 ML IM SUSP
0.5000 mL | INTRAMUSCULAR | Status: AC
  Administered 2012-09-29: 0.5 mL via INTRAMUSCULAR
  Filled 2012-09-28: qty 0.5

## 2012-09-28 MED ORDER — BENZOCAINE-MENTHOL 20-0.5 % EX AERO: 1.0000 "application " | INHALATION_SPRAY | CUTANEOUS | Status: DC | PRN

## 2012-09-28 MED ORDER — METHYLDOPA 500 MG PO TABS
500.0000 mg | ORAL_TABLET | Freq: Two times a day (BID) | ORAL | Status: DC
  Filled 2012-09-28 (×4): qty 1

## 2012-09-28 MED ORDER — OXYTOCIN 40 UNITS IN LACTATED RINGERS INFUSION - SIMPLE MED
62.5000 mL/h | INTRAVENOUS | Status: DC
  Administered 2012-09-28: 62.5 mL/h via INTRAVENOUS
  Filled 2012-09-28: qty 1000

## 2012-09-28 MED ORDER — IBUPROFEN 600 MG PO TABS: 600.0000 mg | ORAL_TABLET | Freq: Four times a day (QID) | ORAL | Status: DC | PRN

## 2012-09-28 MED ORDER — IBUPROFEN 600 MG PO TABS
600.0000 mg | ORAL_TABLET | Freq: Four times a day (QID) | ORAL | Status: DC
  Administered 2012-09-29 – 2012-09-30 (×7): 600 mg via ORAL
  Filled 2012-09-28 (×7): qty 1

## 2012-09-28 MED ORDER — DIBUCAINE 1 % RE OINT: 1.0000 "application " | TOPICAL_OINTMENT | RECTAL | Status: DC | PRN

## 2012-09-28 MED ORDER — WITCH HAZEL-GLYCERIN EX PADS: 1.0000 "application " | MEDICATED_PAD | CUTANEOUS | Status: DC | PRN

## 2012-09-28 MED ORDER — SENNOSIDES-DOCUSATE SODIUM 8.6-50 MG PO TABS
2.0000 | ORAL_TABLET | ORAL | Status: DC
  Administered 2012-09-29 (×2): 2 via ORAL

## 2012-09-28 MED ORDER — OXYTOCIN BOLUS FROM INFUSION: 500.0000 mL | INTRAVENOUS | Status: DC

## 2012-09-28 MED ORDER — PRENATAL MULTIVITAMIN CH
1.0000 | ORAL_TABLET | Freq: Every day | ORAL | Status: DC
  Administered 2012-09-29 – 2012-09-30 (×2): 1 via ORAL
  Filled 2012-09-28 (×2): qty 1

## 2012-09-28 MED ORDER — LIDOCAINE HCL (PF) 1 % IJ SOLN
30.0000 mL | INTRAMUSCULAR | Status: DC | PRN
  Administered 2012-09-28: 30 mL via SUBCUTANEOUS
  Filled 2012-09-28 (×2): qty 30

## 2012-09-28 NOTE — H&P (Cosign Needed)
Audrey Coleman is a 37 y.o. female presenting for ROM and active labor. Prenatal has been complicated by well-controlled HTN. Was on norvasc 5mg  prior to pregnancy and has been well controlled on methyldopa 500mg  PO BID. Thinks water broke about 2 hours ago.    Maternal Medical History:  Reason for admission: Rupture of membranes and contractions.   Contractions: Onset was 1-2 hours ago.   Frequency: regular.   Duration is approximately 20 seconds.   Perceived severity is strong.    Fetal activity: Perceived fetal activity is normal.   Last perceived fetal movement was within the past 12 hours.    Prenatal complications: PIH.   No bleeding, HIV, infection, placental abnormality, pre-eclampsia, preterm labor, substance abuse or thrombocytopenia.   Prenatal Complications - Diabetes: none.    OB History   Grav Para Term Preterm Abortions TAB SAB Ect Mult Living   3 2 2       2      Past Medical History  Diagnosis Date  . Hypertension    Past Surgical History  Procedure Laterality Date  . No past surgeries     Family History: family history includes COPD in her maternal grandmother; Cancer in her paternal aunt; Hypertension in her father and mother. Social History:  reports that she has never smoked. She has never used smokeless tobacco. She reports that she does not drink alcohol or use illicit drugs.   Prenatal Transfer Tool  Maternal Diabetes: No Genetic Screening: Normal Maternal Ultrasounds/Referrals: Normal Fetal Ultrasounds or other Referrals:  None Maternal Substance Abuse:  No Significant Maternal Medications:  None Significant Maternal Lab Results:  None Other Comments:  None  Review of Systems  Constitutional: Negative.   HENT: Negative.   Eyes: Negative.   Respiratory: Negative.   Cardiovascular: Negative.   Gastrointestinal: Negative.   Genitourinary: Negative.   Musculoskeletal: Negative.   Skin: Negative.   Neurological: Negative.    Endo/Heme/Allergies: Negative.   Psychiatric/Behavioral: Negative.       Last menstrual period 01/08/2012. Maternal Exam:  Uterine Assessment: Contraction strength is moderate.  Contraction duration is 20 seconds. Contraction frequency is regular.   Abdomen: Patient reports no abdominal tenderness. Fetal presentation: vertex  Introitus: Ferning test: positive.  Amniotic fluid character: not assessed.  Pelvis: adequate for delivery.   Cervix: Cervix evaluated by digital exam.     Fetal Exam Fetal Monitor Review: Mode: ultrasound.   Baseline rate: 130.  Variability: moderate (6-25 bpm).   Pattern: accelerations present and no decelerations.    Fetal State Assessment: Category I - tracings are normal.     Physical Exam  Nursing note and vitals reviewed. Constitutional: She appears well-developed and well-nourished.  Cardiovascular: Normal rate and regular rhythm.   Respiratory: Effort normal. No respiratory distress.  GI: Soft.   CVX 4/50/-2  Prenatal labs: ABO, Rh: O/Positive/-- (02/05 0000) Antibody: NEG (07/07 0935) Rubella: Immune (02/05 0000) RPR: NON REAC (07/07 0935)  HBsAg: Negative (02/05 0000)  HIV: NON REACTIVE (07/07 0935)  GBS: NEGATIVE (09/05 1115)   Assessment/Plan: 1) Active Labor @ 37.7w with SROM - Admit to Labor & Delivery. Routine care. GBS neg. Expectant mgmt. Plans to breast feed. PPBC TBD. Will augment labor PRN.  2) Chronic HTN - monitor BPs while in labor, continue home med methyldopa 500mg  PO BID once postpartum   Marissa Calamity 09/28/2012, 4:13 PM   I spoke with and examined patient and agree with resident's note and plan of care.  Tawana Scale,  MD OB Fellow 09/28/2012 7:49 PM

## 2012-09-28 NOTE — Telephone Encounter (Signed)
Pt states having cramping and "trickling of clear pinkish fluids all day." Pt informed to go to [facility] to be evaluated. Pt verbalized understanding.

## 2012-09-29 ENCOUNTER — Encounter: Payer: 59 | Admitting: Obstetrics & Gynecology

## 2012-09-29 ENCOUNTER — Other Ambulatory Visit: Payer: 59

## 2012-09-29 LAB — CBC
HCT: 33.2 % — ABNORMAL LOW (ref 36.0–46.0)
Hemoglobin: 11 g/dL — ABNORMAL LOW (ref 12.0–15.0)
MCHC: 33.1 g/dL (ref 30.0–36.0)
RBC: 4.19 MIL/uL (ref 3.87–5.11)
WBC: 20.1 10*3/uL — ABNORMAL HIGH (ref 4.0–10.5)

## 2012-09-29 NOTE — Progress Notes (Signed)
Post Partum Day 1 Subjective: no complaints, voiding and tolerating PO Audrey Coleman is a 37 year old [id number] currently PPD1 following SVD at [redacted]w[redacted]d. She currently has no complaints. Denies any nausea, vomiting, abdominal pain, dizziness, headaches, SOB, or fevers. Patient has been able to ambulate and void urine. No BMs passed since the delivery.  Objective: Blood pressure 115/69, pulse 82, temperature 98.3 F (36.8 C), temperature source Oral, resp. rate 17, last menstrual period 01/08/2012, SpO2 97.00%, unknown if currently breastfeeding.  Physical Exam:  General: alert, cooperative and no distress Lochia: appropriate Uterine Fundus: firm Incision: none   DVT Evaluation: No evidence of DVT seen on physical exam. Negative Homan's sign. No cords or calf tenderness. No significant calf/ankle edema.   Recent Labs  09/28/12 1630 09/29/12 0545  HGB 12.0 11.0*  HCT 36.3 33.2*    Assessment/Plan: Plan for discharge tomorrow, Breastfeeding and Contraception depo    LOS: 1 day   Coy Saunas 09/29/2012, 7:53 AM   I have seen and examined this patient and agree the above assessment. CRESENZO-DISHMAN,Shaquanta Harkless 09/29/2012 9:34 AM

## 2012-09-30 MED ORDER — IBUPROFEN 600 MG PO TABS: 600.0000 mg | ORAL_TABLET | Freq: Four times a day (QID) | ORAL | Status: DC | PRN

## 2012-09-30 NOTE — Discharge Summary (Signed)
Obstetric Discharge Summary Reason for Admission: rupture of membranes Prenatal Procedures: ultrasound Intrapartum Procedures: spontaneous vaginal delivery Postpartum Procedures: none Complications-Operative and Postpartum: 2nd degree perineal laceration Hemoglobin  Date Value Range Status  09/29/2012 11.0* 12.0 - 15.0 g/dL Final  04/22/4096 11.9   Final     HCT  Date Value Range Status  09/29/2012 33.2* 36.0 - 46.0 % Final  04/05/2012 37   Final   Audrey Coleman was admitted in active labor. She received fentanyl for pain. She delivered precipitously and had a 2nd degree laceration, which was repaired. Postpartum, she had no problems. She ambulates well, her pain is controlled. She wants to breastfeed and wants depo for contraception. She will continue her home meds for BP control.  Physical Exam:  General: alert, cooperative and no distress Lochia: appropriate Uterine Fundus: firm Incision: n/a DVT Evaluation: No evidence of DVT seen on physical exam.  Discharge Diagnoses: Term Pregnancy-delivered; CHTN- stable  Discharge Information: Date: 09/30/2012 Activity: pelvic rest Diet: routine Medications: PNV and Ibuprofen, methyldopa 500mg  BID Condition: stable Instructions: refer to practice specific booklet Discharge to: home   Newborn Data: Live born female  Birth Weight: 5 lb 15.6 oz (2710 g) APGAR: 9, 9  Home with mother.  Jacquelin Hawking, MD 09/30/2012, 8:59 AM  I have seen and examined this patient and I agree with the above. Cam Hai 8:56 AM 10/05/2012

## 2012-10-03 ENCOUNTER — Encounter: Payer: 59 | Admitting: Obstetrics & Gynecology

## 2012-10-03 ENCOUNTER — Other Ambulatory Visit: Payer: 59

## 2012-10-20 ENCOUNTER — Encounter: Payer: Self-pay | Admitting: Cardiology

## 2012-10-20 ENCOUNTER — Encounter (HOSPITAL_COMMUNITY): Payer: Self-pay | Admitting: *Deleted

## 2012-10-20 ENCOUNTER — Ambulatory Visit (INDEPENDENT_AMBULATORY_CARE_PROVIDER_SITE_OTHER): Payer: 59 | Admitting: Cardiology

## 2012-10-20 ENCOUNTER — Emergency Department (HOSPITAL_COMMUNITY)
Admission: EM | Admit: 2012-10-20 | Discharge: 2012-10-20 | Disposition: A | Discharge status: Home / Self Care | Payer: 59 | Attending: Emergency Medicine | Admitting: Emergency Medicine

## 2012-10-20 VITALS — BP 143/92 | HR 49 | Ht 61.0 in | Wt 235.0 lb

## 2012-10-20 DIAGNOSIS — R001 Bradycardia, unspecified: Secondary | ICD-10-CM

## 2012-10-20 DIAGNOSIS — I498 Other specified cardiac arrhythmias: Secondary | ICD-10-CM

## 2012-10-20 DIAGNOSIS — R5381 Other malaise: Secondary | ICD-10-CM | POA: Insufficient documentation

## 2012-10-20 DIAGNOSIS — I10 Essential (primary) hypertension: Secondary | ICD-10-CM | POA: Insufficient documentation

## 2012-10-20 DIAGNOSIS — Z79899 Other long term (current) drug therapy: Secondary | ICD-10-CM | POA: Insufficient documentation

## 2012-10-20 LAB — POCT I-STAT, CHEM 8
BUN: 11 mg/dL (ref 6–23)
Calcium, Ion: 1.21 mmol/L (ref 1.12–1.23)
Creatinine, Ser: 1.1 mg/dL (ref 0.50–1.10)
Glucose, Bld: 86 mg/dL (ref 70–99)
TCO2: 25 mmol/L (ref 0–100)

## 2012-10-20 LAB — TSH: TSH: 4.079 u[IU]/mL (ref 0.350–4.500)

## 2012-10-20 MED ORDER — AMLODIPINE BESYLATE 5 MG PO TABS: 5.0000 mg | ORAL_TABLET | Freq: Every day | ORAL | Status: DC

## 2012-10-20 NOTE — Patient Instructions (Addendum)
Your physician recommends that you schedule a follow-up appointment in: 1 month with Dr Wyline Mood  Your physician has recommended you make the following change in your medication:  1. Start Norvasc 5 mg daily 2. Methyldopa 250 mg twice a day for 3 days, then take 250 mg once a day for 3 days, then STOP!

## 2012-10-20 NOTE — ED Notes (Signed)
Patient with no complaints at this time. Respirations even and unlabored. Skin warm/dry. Discharge instructions reviewed with patient at this time. Patient given opportunity to voice concerns/ask questions. Patient discharged at this time and left Emergency Department with steady gait.   

## 2012-10-20 NOTE — ED Notes (Addendum)
Patient reports normal HR is in 70-80's, but since she quit breastfeeding last Monday, she noticed HR is in 40's resting and "tries to come around to normal" with standing and activity. Reports extreme fatigue, but denies dizziness, n/v, congestion. No hx of thyroid disease. Occasional gas pains.  Called OB MD today who told her to come to ER.

## 2012-10-20 NOTE — Progress Notes (Signed)
     Clinical Summary Ms. Dau is a 37 y.o.female  1. Katha Hamming - recent delivery 09/28/12 of a baby girl, uncomplicated.  - recently noted low heart rates in 40s with normal blood pressures, generalized fatigue but no specific dizziness, presyncope, or syncope.  - hx of HTN, previously on norvasc but switched to methyl dopa during pregnancy.  - no chest pain, no palpitations.  - patient not currently breastfeeding.    Past Medical History  Diagnosis Date  . Hypertension      No Known Allergies   Current Outpatient Prescriptions  Medication Sig Dispense Refill  . methyldopa (ALDOMET) 500 MG tablet Take 1 tablet (500 mg total) by mouth 2 (two) times daily.  60 tablet  6   No current facility-administered medications for this visit.     Past Surgical History  Procedure Laterality Date  . No past surgeries       No Known Allergies    Family History  Problem Relation Age of Onset  . Hypertension Mother   . Hypertension Father   . Cancer Paternal Aunt     Ovarian  . COPD Maternal Grandmother      Social History Ms. Pegg reports that she has never smoked. She has never used smokeless tobacco. Ms. Lebron reports that she does not drink alcohol.   Review of Systems CONSTITUTIONAL: No weight loss, fever, chills, weakness or fatigue.  HEENT:  No visual loss, blurred vision, double vision or yellow sclerae.No hearing loss, sneezing, congestion, runny nose or sore throat.  SKIN: No rash or itching.  CARDIOVASCULAR: per HPI RESPIRATORY: No shortness of breath, cough or sputum.  GASTROINTESTINAL: No anorexia, nausea, vomiting or diarrhea. No abdominal pain or blood.  GENITOURINARY: No burning on urination, no polyuria NEUROLOGICAL: No headache, dizziness, syncope, paralysis, ataxia, numbness or tingling in the extremities. No change in bowel or bladder control.  MUSCULOSKELETAL: No muscle, back pain, joint pain or stiffness.  LYMPHATICS: No enlarged nodes. No  history of splenectomy.  PSYCHIATRIC: No history of depression or anxiety.  ENDOCRINOLOGIC: No reports of sweating, cold or heat intolerance. No polyuria or polydipsia.  Marland Kitchen   Physical Examination p 49 bp 143/92 Wt 235 lbs BMI 44 Gen: resting comfortably, no acute distress HEENT: no scleral icterus, pupils equal round and reactive, no palptable cervical adenopathy,  CV: reg rhythm, rate 49, no JVD, no carotid bruits, 1/6 systolic murmur RUSB Resp: Clear to auscultation bilaterally GI: abdomen is soft, non-tender, non-distended, normal bowel sounds, no hepatosplenomegaly MSK: extremities are warm, no edema.  Skin: warm, no rash Neuro:  no focal deficits Psych: appropriate affect   Diagnostic Studies Pertinent labs 10/21/11:  Na 143 K 3.5 Cl 106 BUN 11 Cr 1.1 Hgb 13.9 Hct 33.2  TSH pending  10/20/12 EKG: sinus bradycardia 50, normal axis, PR 120 ms, no ischemic changes  Assessment and Plan  1. Bradycardia - sinus bradycardia on EKG, no evidence of block. Generalized symptoms, no specific dizziness, presyncope, or syncope. Likely related to being on methyldopa now that the increased catecholamine and adrenergic tone associated with pregnancy are gone, she is becoming bradycardic on this medication -wean methyldopa over next 6 days to avoid rebound HTN - restart norvasc 5mg  daily, she reports she did well on this medication prior - of note she is not breastfeeding - follow up 1 month to reevluate. Also follow up on TSH drawn in ER this AM      Antoine Poche, M.D., F.A.C.C.

## 2012-10-20 NOTE — ED Provider Notes (Signed)
CSN: [id number]     Arrival date & time 10/20/12  0913 History  This chart was scribed for Audrey Gaskins, MD by Bennett Scrape, ED Scribe. This patient was seen in room APA06/APA06 and the patient's care was started at 9:50 AM.    Chief Complaint  Patient presents with  . Bradycardia    The history is provided by the patient. No language interpreter was used.   HPI Comments: Audrey Coleman is a 37 y.o. female with a h/o HTN who presents to the Emergency Department complaining of bradycardia in the 40s and 50s noted in the past week while taking her daily BP measurings. Pt states that her baseline HR is in the 70s and 80s. She states that she delivered 3 weeks ago on Sept 11th and noted it dropping since she stopped breastfeeding last week. She was originally on Norvasc but was switched to Methyldopa during the pregnancy by her OB-GYN, Dr. Despina Hidden. She denies that the HTN was caused by preeclampsia. Her last BP was 112/88 this morning. Current BP in the ED is 133/88. She denies being on any other medications currently. She denies any priors episodes of the same. She denies difficulty breathing with laying flat. She denies having a h/o cardiac conditions. She reports fatigue that started yesterday but denies weakness, dizziness, syncope, CP, leg swelling and SOB as associated symptoms.   Past Medical History  Diagnosis Date  . Hypertension    Past Surgical History  Procedure Laterality Date  . No past surgeries     Family History  Problem Relation Age of Onset  . Hypertension Mother   . Hypertension Father   . Cancer Paternal Aunt     Ovarian  . COPD Maternal Grandmother    History  Substance Use Topics  . Smoking status: Never Smoker   . Smokeless tobacco: Never Used  . Alcohol Use: No   OB History   Grav Para Term Preterm Abortions TAB SAB Ect Mult Living   3 3 3       3      Review of Systems  Constitutional: Positive for fatigue.  Respiratory: Negative for shortness of  breath.   Cardiovascular: Negative for chest pain.  Neurological: Negative for dizziness and weakness.  All other systems reviewed and are negative.    Allergies  Review of patient's allergies indicates no known allergies.  Home Medications   Current Outpatient Rx  Name  Route  Sig  Dispense  Refill  . ibuprofen (ADVIL,MOTRIN) 600 MG tablet   Oral   Take 1 tablet (600 mg total) by mouth every 6 (six) hours as needed for pain.   30 tablet   0   . methyldopa (ALDOMET) 500 MG tablet   Oral   Take 1 tablet (500 mg total) by mouth 2 (two) times daily.   60 tablet   6   . Prenatal Vit-Fe Fumarate-FA (PRENATAL MULTIVITAMIN) TABS tablet   Oral   Take 1 tablet by mouth daily at 12 noon.          Triage Vitals: BP 133/88  Pulse 56  Temp(Src) 98.3 F (36.8 C) (Oral)  Resp 16  Ht 5\' 1"  (1.549 m)  Wt 234 lb (106.142 kg)  BMI 44.24 kg/m2  SpO2 100%  Breastfeeding? No  Physical Exam  Nursing note and vitals reviewed.  CONSTITUTIONAL: Well developed/well nourished HEAD: Normocephalic/atraumatic EYES: EOMI/PERRL ENMT: Mucous membranes moist NECK: supple no meningeal signs SPINE:entire spine nontender CV: S1/S2 noted, no murmurs/rubs/gallops  noted, bradycardia  LUNGS: Lungs are clear to auscultation bilaterally, no apparent distress ABDOMEN: soft, nontender, no rebound or guarding GU:no cva tenderness NEURO: Pt is awake/alert, moves all extremitiesx4 EXTREMITIES: pulses normal, full ROM SKIN: warm, color normal PSYCH: no abnormalities of mood noted  ED Course  Procedures (including critical care time)  DIAGNOSTIC STUDIES: Oxygen Saturation is 100% on room air, normal by my interpretation.    COORDINATION OF CARE: 10:04 AM-Reviewed side effects of methyldopia, which includes bradycardia. Informed pt that her EKG just shows bradycardia but no Discussed treatment plan which includes consult with OB-GYN with pt at bedside and pt agreed to plan.   10:46 AM D/w dr  branch local cardiologist He will see her in office today at 1340 Pt is agreeable with this plan Suspect this may be medication related and may need to have this discontinued  Labs Review Labs Reviewed  TSH   Imaging Review No results found.  MDM  No diagnosis found. Nursing notes including past medical history and social history reviewed and considered in documentation Labs/vital reviewed and considered     Date: 10/20/2012 0926am  Rate: 51  Rhythm: sinus bradycardia  QRS Axis: normal  Intervals: normal  ST/T Wave abnormalities: nonspecific ST changes and t wave inversion in lead III  Conduction Disutrbances:none  Narrative Interpretation:   Old EKG Reviewed: none available at time of interpretation    I personally performed the services described in this documentation, which was scribed in my presence. The recorded information has been reviewed and is accurate.      Audrey Gaskins, MD 10/20/12 1047

## 2012-11-09 ENCOUNTER — Ambulatory Visit (INDEPENDENT_AMBULATORY_CARE_PROVIDER_SITE_OTHER): Payer: 59 | Admitting: Advanced Practice Midwife

## 2012-11-09 ENCOUNTER — Encounter: Payer: Self-pay | Admitting: Advanced Practice Midwife

## 2012-11-09 VITALS — BP 122/80 | Ht 61.0 in | Wt 237.0 lb

## 2012-11-09 DIAGNOSIS — Z309 Encounter for contraceptive management, unspecified: Secondary | ICD-10-CM

## 2012-11-09 DIAGNOSIS — Z3049 Encounter for surveillance of other contraceptives: Secondary | ICD-10-CM

## 2012-11-09 DIAGNOSIS — Z3202 Encounter for pregnancy test, result negative: Secondary | ICD-10-CM

## 2012-11-09 DIAGNOSIS — I10 Essential (primary) hypertension: Secondary | ICD-10-CM

## 2012-11-09 DIAGNOSIS — Z3200 Encounter for pregnancy test, result unknown: Secondary | ICD-10-CM

## 2012-11-09 LAB — POCT URINE PREGNANCY: Preg Test, Ur: NEGATIVE

## 2012-11-09 MED ORDER — MEDROXYPROGESTERONE ACETATE 150 MG/ML IM SUSP
150.0000 mg | Freq: Once | INTRAMUSCULAR | Status: AC
  Administered 2012-11-09: 150 mg via INTRAMUSCULAR

## 2012-11-09 MED ORDER — MEDROXYPROGESTERONE ACETATE 150 MG/ML IM SUSP: 150.0000 mg | INTRAMUSCULAR | Status: DC

## 2012-11-09 NOTE — Progress Notes (Signed)
  Audrey Coleman is a 37 y.o. who presents for a postpartum visit. She is 6 weeks postpartum following a spontaneous vaginal delivery. I have fully reviewed the prenatal and intrapartum course. The delivery was at 37.5 gestational weeks.  Anesthesia: none. Postpartum course has been complicated by transient bradycardia"Likely related to being on methyldopa now that the increased catecholamine and adrenergic tone associated with pregnancy are gone, she is becoming bradycardic on this medication 'per cardiologist.  HR 60's now, increasing daily.  Changed to Norvasc. Baby's course has been uneventful. Baby is feeding by bottle. Bleeding: no bleeding. Bowel function is normal. Bladder function is normal. Patient is not sexually active. Contraception method is Depo-Provera injections. Postpartum depression screening: negative.    Review of Systems   Constitutional: Negative for fever and chills Eyes: Negative for visual disturbances Respiratory: Negative for shortness of breath, dyspnea Cardiovascular: Negative for chest pain or palpitations  Gastrointestinal: Negative for vomiting, diarrhea and constipation Genitourinary: Negative for dysuria and urgency Musculoskeletal: Negative for back pain, joint pain, myalgias  Neurological: Negative for dizziness and headaches   Objective:     Filed Vitals:   11/09/12 1015  BP: 122/80   General:  alert, cooperative and no distress   Breasts:  negative  Lungs: clear to auscultation bilaterally  Heart:  regular rate and rhythm  Abdomen: Soft, nontender   Vulva:  normal  Vagina: normal vagina  Cervix:  closed  Corpus: Well involuted     Rectal Exam: no hemorrhoids        Assessment:   normal postpartum exam.  Plan:    1. Contraception: Depo-Provera injections 2. Follow up in:  this afternood for depo or as needed.

## 2012-11-09 NOTE — Progress Notes (Signed)
Patient ID: Audrey Coleman, female   DOB: May 08, 1975, 37 y.o.   MRN: [id number] Pt here this afternoon for depo provera. Pt was seen for Partpartum visit this am with Cathie Beams, CNM, B/P 122/80, Negative Urine Pregnancy test.   Pt tolerated Depo Provera 150 mg injection well.

## 2012-11-20 ENCOUNTER — Encounter: Payer: Self-pay | Admitting: Cardiology

## 2012-11-20 ENCOUNTER — Ambulatory Visit (INDEPENDENT_AMBULATORY_CARE_PROVIDER_SITE_OTHER): Payer: 59 | Admitting: Cardiology

## 2012-11-20 VITALS — BP 133/84 | HR 78 | Ht 61.0 in | Wt 237.0 lb

## 2012-11-20 DIAGNOSIS — R001 Bradycardia, unspecified: Secondary | ICD-10-CM

## 2012-11-20 DIAGNOSIS — I498 Other specified cardiac arrhythmias: Secondary | ICD-10-CM

## 2012-11-20 DIAGNOSIS — R011 Cardiac murmur, unspecified: Secondary | ICD-10-CM | POA: Insufficient documentation

## 2012-11-20 DIAGNOSIS — I10 Essential (primary) hypertension: Secondary | ICD-10-CM

## 2012-11-20 NOTE — Patient Instructions (Addendum)
Your physician recommends that you schedule a follow-up appointment in: AS NEEDED  Your physician has requested that you have an echocardiogram. Echocardiography is a painless test that uses sound waves to create images of your heart. It provides your doctor with information about the size and shape of your heart and how well your heart's chambers and valves are working. This procedure takes approximately one hour. There are no restrictions for this procedure.  WE WILL CALL YOU WITH YOUR TEST RESULTS/INSTRUCTIONS/NEXT STEPS ONCE RECEIVED BY THE PROVIDER

## 2012-11-20 NOTE — Progress Notes (Signed)
Clinical Summary Audrey Coleman is a 37 y.o.female seen today for follow up for the following problems.   1. Bradycarida  - recent delivery 09/28/12 of a baby girl, uncomplicated.  - few days after delivery noted low heart rates in 40s with normal blood pressures, generalized fatigue but no specific dizziness, presyncope, or syncope.  - hx of HTN, previously on norvasc but switched to methyl dopa during pregnancy.  - no chest pain, no palpitations.  - patient not currently breastfeeding.   - since last visit stopped her methyldopa and switched her to norvasc for HTN. She reports her symptoms have resolved and her pulse rates have normalized.    Past Medical History  Diagnosis Date  . Hypertension      No Known Allergies   Current Outpatient Prescriptions  Medication Sig Dispense Refill  . amLODipine (NORVASC) 5 MG tablet Take 1 tablet (5 mg total) by mouth daily.  180 tablet  3  . medroxyPROGESTERone (DEPO-PROVERA) 150 MG/ML injection Inject 1 mL (150 mg total) into the muscle every 3 (three) months.  1 mL  3   No current facility-administered medications for this visit.     Past Surgical History  Procedure Laterality Date  . No past surgeries       No Known Allergies    Family History  Problem Relation Age of Onset  . Hypertension Mother   . Hypertension Father   . Cancer Paternal Aunt     Ovarian  . COPD Maternal Grandmother      Social History Audrey Coleman reports that she has never smoked. She has never used smokeless tobacco. Audrey Coleman reports that she does not drink alcohol.   Review of Systems CONSTITUTIONAL: No weight loss, fever, chills, weakness or fatigue.  HEENT: Eyes: No visual loss, blurred vision, double vision or yellow sclerae.No hearing loss, sneezing, congestion, runny nose or sore throat.  SKIN: No rash or itching.  CARDIOVASCULAR: per HPI RESPIRATORY: No shortness of breath, cough or sputum.  GASTROINTESTINAL: No anorexia, nausea,  vomiting or diarrhea. No abdominal pain or blood.  GENITOURINARY: No burning on urination, no polyuria NEUROLOGICAL: No headache, dizziness, syncope, paralysis, ataxia, numbness or tingling in the extremities. No change in bowel or bladder control.  MUSCULOSKELETAL: No muscle, back pain, joint pain or stiffness.  LYMPHATICS: No enlarged nodes. No history of splenectomy.  PSYCHIATRIC: No history of depression or anxiety.  ENDOCRINOLOGIC: No reports of sweating, cold or heat intolerance. No polyuria or polydipsia.  Marland Kitchen   Physical Examination p 88 bp 135/75 Wt 251 lbs BMI 32 Gen: resting comfortably, no acute distress HEENT: no scleral icterus, pupils equal round and reactive, no palptable cervical adenopathy,  CV: RRR, 2-3/6 systolic murmur RUSB, no JVD no carotid bruit Resp: Clear to auscultation bilaterally GI: abdomen is soft, non-tender, non-distended, normal bowel sounds, no hepatosplenomegaly MSK: extremities are warm, no edema.  Skin: warm, no rash Neuro:  no focal deficits Psych: appropriate affect   Diagnostic Studies Pertinent labs 10/21/11:  Na 143 K 3.5 Cl 106 BUN 11 Cr 1.1 Hgb 13.9 Hct 33.2  TSH pending  10/20/12 EKG: sinus bradycardia 50, normal axis, PR 120 ms, no ischemic changes     Assessment and Plan  1. Bradycardia  - resolved after stopping methyldopa - no further cardiac workup or interventions neccesary. Her TSH was never sent it seems, but since bradycardia has resolved off methyldopa, will not reorder  2. HTN - controlled on norvasc, she is not breastfeeding.  Continue current meds  3. Heart murmur - denies any significant symptoms. Noted on last visit, however that close to delivery assumed it was still related to her pregnancy. Murmur persists today. - will check echo to evaluate murmur     Antoine Poche, M.D., F.A.C.C.

## 2012-11-24 ENCOUNTER — Ambulatory Visit (HOSPITAL_COMMUNITY)
Admission: RE | Admit: 2012-11-24 | Discharge: 2012-11-24 | Disposition: A | Discharge status: Home / Self Care | Payer: 59 | Source: Ambulatory Visit | Attending: Cardiology | Admitting: Cardiology

## 2012-11-24 DIAGNOSIS — I369 Nonrheumatic tricuspid valve disorder, unspecified: Secondary | ICD-10-CM

## 2012-11-24 DIAGNOSIS — I10 Essential (primary) hypertension: Secondary | ICD-10-CM | POA: Insufficient documentation

## 2012-11-24 DIAGNOSIS — R011 Cardiac murmur, unspecified: Secondary | ICD-10-CM | POA: Insufficient documentation

## 2012-11-24 DIAGNOSIS — Z6841 Body Mass Index (BMI) 40.0 and over, adult: Secondary | ICD-10-CM | POA: Insufficient documentation

## 2012-11-24 NOTE — Progress Notes (Signed)
*  PRELIMINARY RESULTS* Echocardiogram 2D Echocardiogram has been performed.  Audrey Coleman 11/24/2012, 2:40 PM

## 2012-11-29 ENCOUNTER — Telehealth: Payer: Self-pay

## 2012-11-29 NOTE — Telephone Encounter (Signed)
Message copied by Nori Riis on Wed Nov 29, 2012  4:29 PM ------      Message from: Dixon F      Created: Wed Nov 29, 2012  1:02 PM       Please let patient know her echo is normal                  Dina Rich MD ------

## 2012-11-29 NOTE — Telephone Encounter (Signed)
Pt informed via voice mail echo is normal

## 2013-02-01 ENCOUNTER — Ambulatory Visit: Payer: 59

## 2013-02-02 ENCOUNTER — Ambulatory Visit: Payer: 59

## 2013-02-05 ENCOUNTER — Encounter: Payer: Self-pay | Admitting: Adult Health

## 2013-02-05 ENCOUNTER — Ambulatory Visit (INDEPENDENT_AMBULATORY_CARE_PROVIDER_SITE_OTHER): Payer: 59 | Admitting: Adult Health

## 2013-02-05 VITALS — BP 128/80 | Ht 61.0 in | Wt 247.0 lb

## 2013-02-05 DIAGNOSIS — Z3049 Encounter for surveillance of other contraceptives: Secondary | ICD-10-CM

## 2013-02-05 DIAGNOSIS — Z309 Encounter for contraceptive management, unspecified: Secondary | ICD-10-CM

## 2013-02-05 DIAGNOSIS — Z3202 Encounter for pregnancy test, result negative: Secondary | ICD-10-CM

## 2013-02-05 LAB — POCT URINE PREGNANCY: PREG TEST UR: NEGATIVE

## 2013-02-05 MED ORDER — MEDROXYPROGESTERONE ACETATE 150 MG/ML IM SUSP
150.0000 mg | Freq: Once | INTRAMUSCULAR | Status: AC
Start: 2013-02-05 — End: 2013-02-05
  Administered 2013-02-05: 150 mg via INTRAMUSCULAR

## 2013-02-05 NOTE — Progress Notes (Signed)
Patient ID: Audrey Coleman, female   DOB: 11/04/1975, 38 y.o.   MRN: [id number] Depo Provera 150 mg given left deltoid with no complications, resulted negative pregnancy test. Pt to return in 12 weeks for next injection.

## 2013-04-30 ENCOUNTER — Ambulatory Visit (INDEPENDENT_AMBULATORY_CARE_PROVIDER_SITE_OTHER): Payer: 59 | Admitting: Adult Health

## 2013-04-30 ENCOUNTER — Encounter: Payer: Self-pay | Admitting: Adult Health

## 2013-04-30 VITALS — BP 120/80 | Ht 61.0 in | Wt 253.0 lb

## 2013-04-30 DIAGNOSIS — Z3200 Encounter for pregnancy test, result unknown: Secondary | ICD-10-CM

## 2013-04-30 DIAGNOSIS — Z3049 Encounter for surveillance of other contraceptives: Secondary | ICD-10-CM

## 2013-04-30 DIAGNOSIS — Z309 Encounter for contraceptive management, unspecified: Secondary | ICD-10-CM

## 2013-04-30 DIAGNOSIS — Z3202 Encounter for pregnancy test, result negative: Secondary | ICD-10-CM

## 2013-04-30 LAB — POCT URINE PREGNANCY: PREG TEST UR: NEGATIVE

## 2013-04-30 MED ORDER — MEDROXYPROGESTERONE ACETATE 150 MG/ML IM SUSP
150.0000 mg | Freq: Once | INTRAMUSCULAR | Status: AC
  Administered 2013-04-30: 150 mg via INTRAMUSCULAR

## 2013-07-23 ENCOUNTER — Encounter: Payer: Self-pay | Admitting: Adult Health

## 2013-07-23 ENCOUNTER — Ambulatory Visit (INDEPENDENT_AMBULATORY_CARE_PROVIDER_SITE_OTHER): Payer: Commercial Managed Care - PPO | Admitting: Adult Health

## 2013-07-23 DIAGNOSIS — Z3202 Encounter for pregnancy test, result negative: Secondary | ICD-10-CM

## 2013-07-23 DIAGNOSIS — Z3042 Encounter for surveillance of injectable contraceptive: Secondary | ICD-10-CM

## 2013-07-23 DIAGNOSIS — Z3049 Encounter for surveillance of other contraceptives: Secondary | ICD-10-CM

## 2013-07-23 LAB — POCT URINE PREGNANCY: PREG TEST UR: NEGATIVE

## 2013-07-23 MED ORDER — MEDROXYPROGESTERONE ACETATE 150 MG/ML IM SUSP
150.0000 mg | Freq: Once | INTRAMUSCULAR | Status: AC
  Administered 2013-07-23: 150 mg via INTRAMUSCULAR

## 2013-10-14 ENCOUNTER — Other Ambulatory Visit: Payer: Self-pay | Admitting: Advanced Practice Midwife

## 2013-10-15 ENCOUNTER — Ambulatory Visit: Payer: Commercial Managed Care - PPO

## 2013-10-16 ENCOUNTER — Ambulatory Visit (INDEPENDENT_AMBULATORY_CARE_PROVIDER_SITE_OTHER): Payer: 59 | Admitting: Adult Health

## 2013-10-16 ENCOUNTER — Encounter: Payer: Self-pay | Admitting: Adult Health

## 2013-10-16 DIAGNOSIS — Z3042 Encounter for surveillance of injectable contraceptive: Secondary | ICD-10-CM

## 2013-10-16 DIAGNOSIS — Z3202 Encounter for pregnancy test, result negative: Secondary | ICD-10-CM

## 2013-10-16 DIAGNOSIS — Z3049 Encounter for surveillance of other contraceptives: Secondary | ICD-10-CM

## 2013-10-16 LAB — POCT URINE PREGNANCY: Preg Test, Ur: NEGATIVE

## 2013-10-16 MED ORDER — MEDROXYPROGESTERONE ACETATE 150 MG/ML IM SUSP
150.0000 mg | Freq: Once | INTRAMUSCULAR | Status: AC
  Administered 2013-10-16: 150 mg via INTRAMUSCULAR

## 2013-11-02 ENCOUNTER — Other Ambulatory Visit: Payer: Self-pay

## 2013-11-19 ENCOUNTER — Encounter: Payer: Self-pay | Admitting: Adult Health

## 2014-01-08 ENCOUNTER — Ambulatory Visit (INDEPENDENT_AMBULATORY_CARE_PROVIDER_SITE_OTHER): Payer: 59 | Admitting: Adult Health

## 2014-01-08 ENCOUNTER — Encounter: Payer: Self-pay | Admitting: Adult Health

## 2014-01-08 DIAGNOSIS — Z3042 Encounter for surveillance of injectable contraceptive: Secondary | ICD-10-CM

## 2014-01-08 DIAGNOSIS — Z3202 Encounter for pregnancy test, result negative: Secondary | ICD-10-CM

## 2014-01-08 LAB — POCT URINE PREGNANCY: Preg Test, Ur: NEGATIVE

## 2014-01-08 MED ORDER — MEDROXYPROGESTERONE ACETATE 150 MG/ML IM SUSP
150.0000 mg | Freq: Once | INTRAMUSCULAR | Status: AC
  Administered 2014-01-08: 150 mg via INTRAMUSCULAR

## 2014-02-26 ENCOUNTER — Other Ambulatory Visit (HOSPITAL_COMMUNITY): Payer: Self-pay | Admitting: Family Medicine

## 2014-02-26 DIAGNOSIS — Z1231 Encounter for screening mammogram for malignant neoplasm of breast: Secondary | ICD-10-CM

## 2014-03-04 ENCOUNTER — Ambulatory Visit (HOSPITAL_COMMUNITY): Payer: Self-pay

## 2014-03-11 ENCOUNTER — Ambulatory Visit (HOSPITAL_COMMUNITY)
Admission: RE | Admit: 2014-03-11 | Discharge: 2014-03-11 | Disposition: A | Discharge status: Home / Self Care | Payer: 59 | Source: Ambulatory Visit | Attending: Family Medicine | Admitting: Family Medicine

## 2014-03-11 DIAGNOSIS — Z1231 Encounter for screening mammogram for malignant neoplasm of breast: Secondary | ICD-10-CM | POA: Diagnosis present

## 2014-04-02 ENCOUNTER — Ambulatory Visit: Payer: 59

## 2015-02-12 ENCOUNTER — Telehealth: Payer: Self-pay | Admitting: Women's Health

## 2015-02-12 MED ORDER — MEDROXYPROGESTERONE ACETATE 150 MG/ML IM SUSP: 150.0000 mg | INTRAMUSCULAR | Status: DC

## 2015-02-12 NOTE — Telephone Encounter (Signed)
Depo Provera e-scribed per pt request.

## 2015-02-18 ENCOUNTER — Ambulatory Visit (INDEPENDENT_AMBULATORY_CARE_PROVIDER_SITE_OTHER): Payer: BLUE CROSS/BLUE SHIELD | Admitting: *Deleted

## 2015-02-18 ENCOUNTER — Other Ambulatory Visit: Payer: BLUE CROSS/BLUE SHIELD

## 2015-02-18 ENCOUNTER — Encounter: Payer: Self-pay | Admitting: *Deleted

## 2015-02-18 DIAGNOSIS — Z3042 Encounter for surveillance of injectable contraceptive: Secondary | ICD-10-CM | POA: Diagnosis not present

## 2015-02-18 LAB — BETA HCG QUANT (REF LAB)

## 2015-02-18 MED ORDER — MEDROXYPROGESTERONE ACETATE 150 MG/ML IM SUSP
150.0000 mg | Freq: Once | INTRAMUSCULAR | Status: AC
  Administered 2015-02-18: 150 mg via INTRAMUSCULAR

## 2015-02-18 NOTE — Progress Notes (Signed)
Pt here for Depo. Had neg quant this am. Reports no problems at this time. Return in 12 weeks for next shot. JSY 

## 2015-05-13 ENCOUNTER — Encounter: Payer: Self-pay | Admitting: *Deleted

## 2015-05-13 ENCOUNTER — Ambulatory Visit (INDEPENDENT_AMBULATORY_CARE_PROVIDER_SITE_OTHER): Payer: BLUE CROSS/BLUE SHIELD | Admitting: *Deleted

## 2015-05-13 DIAGNOSIS — Z3202 Encounter for pregnancy test, result negative: Secondary | ICD-10-CM

## 2015-05-13 DIAGNOSIS — Z3042 Encounter for surveillance of injectable contraceptive: Secondary | ICD-10-CM

## 2015-05-13 LAB — POCT URINE PREGNANCY: Preg Test, Ur: NEGATIVE

## 2015-05-13 MED ORDER — MEDROXYPROGESTERONE ACETATE 150 MG/ML IM SUSP
150.0000 mg | Freq: Once | INTRAMUSCULAR | Status: AC
  Administered 2015-05-13: 150 mg via INTRAMUSCULAR

## 2015-05-13 NOTE — Progress Notes (Signed)
Pt here for Depo. Pt tolerated shot well. Return in 12 weeks for next shot. JSY 

## 2015-08-04 ENCOUNTER — Encounter: Payer: Self-pay | Admitting: *Deleted

## 2015-08-04 ENCOUNTER — Ambulatory Visit (INDEPENDENT_AMBULATORY_CARE_PROVIDER_SITE_OTHER): Payer: BLUE CROSS/BLUE SHIELD | Admitting: *Deleted

## 2015-08-04 DIAGNOSIS — Z3042 Encounter for surveillance of injectable contraceptive: Secondary | ICD-10-CM

## 2015-08-04 DIAGNOSIS — Z3202 Encounter for pregnancy test, result negative: Secondary | ICD-10-CM | POA: Diagnosis not present

## 2015-08-04 LAB — POCT URINE PREGNANCY: PREG TEST UR: NEGATIVE

## 2015-08-04 MED ORDER — MEDROXYPROGESTERONE ACETATE 150 MG/ML IM SUSP
150.0000 mg | Freq: Once | INTRAMUSCULAR | Status: AC
  Administered 2015-08-04: 150 mg via INTRAMUSCULAR

## 2015-08-04 NOTE — Progress Notes (Signed)
Patient ID: [name], female   DOB: 01/22/1975, 40 y.o.   MRN: [id number] Depo Provera 150 mg IM injection given in left deltoid with no complications, negative pregnancy test. Pt to return in 12 weeks for next injection.

## 2015-08-05 ENCOUNTER — Ambulatory Visit: Payer: BLUE CROSS/BLUE SHIELD

## 2015-08-14 DIAGNOSIS — E6609 Other obesity due to excess calories: Secondary | ICD-10-CM | POA: Diagnosis not present

## 2015-08-14 DIAGNOSIS — R030 Elevated blood-pressure reading, without diagnosis of hypertension: Secondary | ICD-10-CM | POA: Diagnosis not present

## 2015-08-14 DIAGNOSIS — Z6836 Body mass index (BMI) 36.0-36.9, adult: Secondary | ICD-10-CM | POA: Diagnosis not present

## 2015-08-14 DIAGNOSIS — Z1389 Encounter for screening for other disorder: Secondary | ICD-10-CM | POA: Diagnosis not present

## 2015-08-19 DIAGNOSIS — L03011 Cellulitis of right finger: Secondary | ICD-10-CM | POA: Diagnosis not present

## 2015-08-25 ENCOUNTER — Telehealth: Payer: Self-pay | Admitting: *Deleted

## 2015-08-25 NOTE — Telephone Encounter (Signed)
Pt called back returning Chrystal's phone call.

## 2015-08-25 NOTE — Telephone Encounter (Signed)
Pt c/o BTB with depo, started depo in February of this year, last shot July. Pt states she has had spotting since the second shot, but is now having regular vaginal bleeding x 2 weeks.

## 2015-08-25 NOTE — Telephone Encounter (Signed)
Left message @ 10:59 am. JSY 

## 2015-08-25 NOTE — Telephone Encounter (Signed)
Pt returned your call at 11:07/ told pt you would have to call her back

## 2015-08-26 ENCOUNTER — Encounter: Payer: Self-pay | Admitting: Adult Health

## 2015-08-26 ENCOUNTER — Ambulatory Visit (INDEPENDENT_AMBULATORY_CARE_PROVIDER_SITE_OTHER): Payer: BLUE CROSS/BLUE SHIELD | Admitting: Adult Health

## 2015-08-26 DIAGNOSIS — N939 Abnormal uterine and vaginal bleeding, unspecified: Secondary | ICD-10-CM | POA: Diagnosis not present

## 2015-08-26 HISTORY — DX: Abnormal uterine and vaginal bleeding, unspecified: N93.9

## 2015-08-26 MED ORDER — MEGESTROL ACETATE 40 MG PO TABS
ORAL_TABLET | ORAL | 1 refills | Status: DC
Start: 2015-08-26 — End: 2015-10-27

## 2015-08-26 NOTE — Patient Instructions (Signed)
Dysfunctional Uterine Bleeding Dysfunctional uterine bleeding is abnormal bleeding from the uterus. Dysfunctional uterine bleeding includes:  A period that comes earlier or later than usual.  A period that is lighter, heavier, or has blood clots.  Bleeding between periods.  Skipping one or more periods.  Bleeding after sexual intercourse.  Bleeding after menopause. HOME CARE INSTRUCTIONS  Pay attention to any changes in your symptoms. Follow these instructions to help with your condition: Eating  Eat well-balanced meals. Include foods that are high in iron, such as liver, meat, shellfish, green leafy vegetables, and eggs.  If you become constipated:  Drink plenty of water.  Eat fruits and vegetables that are high in water and fiber, such as spinach, carrots, raspberries, apples, and mango. Medicines  Take over-the-counter and prescription medicines only as told by your health care provider.  Do not change medicines without talking with your health care provider.  Aspirin or medicines that contain aspirin may make the bleeding worse. Do not take those medicines:  During the week before your period.  During your period.  If you were prescribed iron pills, take them as told by your health care provider. Iron pills help to replace iron that your body loses because of this condition. Activity  If you need to change your sanitary pad or tampon more than one time every 2 hours:  Lie in bed with your feet raised (elevated).  Place a cold pack on your lower abdomen.  Rest as much as possible until the bleeding stops or slows down.  Do not try to lose weight until the bleeding has stopped and your blood iron level is back to normal. Other Instructions  For two months, write down:  When your period starts.  When your period ends.  When any abnormal bleeding occurs.  What problems you notice.  Keep all follow up visits as told by your health care provider. This is  important. SEEK MEDICAL CARE IF:  You get light-headed or weak.  You have nausea and vomiting.  You cannot eat or drink without vomiting.  You feel dizzy or have diarrhea while you are taking medicines.  You are taking birth control pills or hormones, and you want to change them or stop taking them. SEEK IMMEDIATE MEDICAL CARE IF:  You develop a fever or chills.  You need to change your sanitary pad or tampon more than one time per hour.  Your bleeding becomes heavier, or your flow contains clots more often.  You develop pain in your abdomen.  You lose consciousness.  You develop a rash.   This information is not intended to replace advice given to you by your health care provider. Make sure you discuss any questions you have with your health care provider.   Document Released: 01/02/2000 Document Revised: 09/25/2014 Document Reviewed: 04/01/2014 Elsevier Interactive Patient Education 2016 [employer] Take megace  Return in about 2 weeks for pap and physical

## 2015-08-26 NOTE — Progress Notes (Signed)
Subjective:     Patient ID: Audrey Coleman, female   DOB: 02/15/1975, 40 y.o.   MRN: [id number]  HPI Audrey Coleman os a 40 year old black female in complaining of bleeding on Depo provera, her last injection was 08/04/15 and she has been bleeding full on since then, but had spotting on and off before that.She has no pain but an occasional cramp and she does have a new partner.She had a negative UPT 08/04/15, before her injection.  Review of Systems Patient denies any headaches, hearing loss, fatigue, blurred vision, shortness of breath, chest pain, abdominal pain, problems with bowel movements, urination, or intercourse. No joint pain or mood swings.See HPI for positves. Reviewed past medical,surgical, social and family history. Reviewed medications and allergies.     Objective:   Physical Exam BP 120/80 (BP Location: Left Arm, Patient Position: Sitting, Cuff Size: Normal)   Pulse 61   Ht 5\' 1"  (1.549 m)   Wt 186 lb 8 oz (84.6 kg)   LMP 08/13/2015 (Approximate)   BMI 35.24 kg/m  Skin warm and dry.Pelvic: external genitalia is normal in appearance no lesions, vagina: period like blood, no odor,urethra has no lesions or masses noted, cervix:smooth and bulbous, uterus: normal size, shape and contour, non tender, no masses felt, adnexa: no masses or tenderness noted. Bladder is non tender and no masses felt.  GC/CHL obtained.    Discussed that will check for GC/CHL and will give megace to see if that stops the bleeding and will see in back in 2 weeks as she needs pap and physical. Face time 15 minutes.  Assessment:     AUB     Plan:     GC/CHL sent Rx megace 40 mg #45 3 x 5 days then 2 x 5 days then 1 daily with 1 refill Return in about 2 weeks for pap and physical   Review handout on AUB

## 2015-08-28 LAB — GC/CHLAMYDIA PROBE AMP
CHLAMYDIA, DNA PROBE: NEGATIVE
Neisseria gonorrhoeae by PCR: NEGATIVE

## 2015-09-09 ENCOUNTER — Encounter: Payer: Self-pay | Admitting: Adult Health

## 2015-09-09 ENCOUNTER — Ambulatory Visit (INDEPENDENT_AMBULATORY_CARE_PROVIDER_SITE_OTHER): Payer: BLUE CROSS/BLUE SHIELD | Admitting: Adult Health

## 2015-09-09 ENCOUNTER — Other Ambulatory Visit (HOSPITAL_COMMUNITY)
Admission: RE | Admit: 2015-09-09 | Discharge: 2015-09-09 | Disposition: A | Discharge status: Home / Self Care | Payer: BLUE CROSS/BLUE SHIELD | Source: Ambulatory Visit | Attending: Adult Health | Admitting: Adult Health

## 2015-09-09 VITALS — BP 126/80 | HR 66 | Ht 61.0 in | Wt 180.0 lb

## 2015-09-09 DIAGNOSIS — Z1151 Encounter for screening for human papillomavirus (HPV): Secondary | ICD-10-CM | POA: Insufficient documentation

## 2015-09-09 DIAGNOSIS — Z01419 Encounter for gynecological examination (general) (routine) without abnormal findings: Secondary | ICD-10-CM

## 2015-09-09 NOTE — Patient Instructions (Addendum)
Physical in 1 year,pap in 3 if normal Mammogram at 41  

## 2015-09-09 NOTE — Progress Notes (Signed)
Patient ID: Audrey Coleman, female   DOB: 09/26/1975, 40 y.o.   MRN: [id number] History of Present Illness: [name] is a 40 year old black female in for a well woman gyn exam and pap.She was seen  8/8 for AUB on depo and given megace and has just about stopped all the bleeding.   Current Medications, Allergies, Past Medical History, Past Surgical History, Family History and Social History were reviewed in [facility] electronic medical record.     Review of Systems: Patient denies any headaches, hearing loss, fatigue, blurred vision, shortness of breath, chest pain, abdominal pain, problems with bowel movements, urination, or intercourse. No joint pain or mood swings.    Physical Exam:BP 126/80 (BP Location: Left Arm, Patient Position: Sitting, Cuff Size: Normal)   Pulse 66   Ht 5\' 1"  (1.549 m)   Wt 180 lb (81.6 kg)   LMP 08/13/2015 (Approximate)   BMI 34.01 kg/m  General:  Well developed, well nourished, no acute distress Skin:  Warm and dry Neck:  Midline trachea, normal thyroid, good ROM, no lymphadenopathy Lungs; Clear to auscultation bilaterally Breast:  No dominant palpable mass, retraction, or nipple discharge Cardiovascular: Regular rate and rhythm Abdomen:  Soft, non tender, no hepatosplenomegaly Pelvic:  External genitalia is normal in appearance, no lesions.  The vagina is normal in appearance. Urethra has no lesions or masses. The cervix is bulbous.Pap with HPV performed.  Uterus is felt to be normal size, shape, and contour.  No adnexal masses or tenderness noted.Bladder is non tender, no masses felt. Extremities/musculoskeletal:  No swelling or varicosities noted, no clubbing or cyanosis Psych:  No mood changes, alert and cooperative,seems happy   Impression: Well woman gyn exam and pap    Plan: Next depo  October 9 Continue megace til no spotting Physical in 1 year, pap in 3 if normal  Mammogram at 2040

## 2015-09-10 ENCOUNTER — Telehealth: Payer: Self-pay | Admitting: Adult Health

## 2015-09-10 LAB — CYTOLOGY - PAP

## 2015-09-10 MED ORDER — METRONIDAZOLE 500 MG PO TABS: 500.0000 mg | ORAL_TABLET | Freq: Two times a day (BID) | ORAL | 0 refills | Status: DC

## 2015-09-10 NOTE — Telephone Encounter (Signed)
Pt aware pap normal +BV will rx flagyl

## 2015-10-27 ENCOUNTER — Other Ambulatory Visit: Payer: Self-pay | Admitting: *Deleted

## 2015-10-27 ENCOUNTER — Encounter: Payer: Self-pay | Admitting: *Deleted

## 2015-10-27 ENCOUNTER — Ambulatory Visit (INDEPENDENT_AMBULATORY_CARE_PROVIDER_SITE_OTHER): Payer: BLUE CROSS/BLUE SHIELD | Admitting: *Deleted

## 2015-10-27 DIAGNOSIS — Z3042 Encounter for surveillance of injectable contraceptive: Secondary | ICD-10-CM | POA: Diagnosis not present

## 2015-10-27 DIAGNOSIS — Z3202 Encounter for pregnancy test, result negative: Secondary | ICD-10-CM

## 2015-10-27 DIAGNOSIS — Z308 Encounter for other contraceptive management: Secondary | ICD-10-CM

## 2015-10-27 LAB — POCT URINE PREGNANCY: PREG TEST UR: NEGATIVE

## 2015-10-27 MED ORDER — MEDROXYPROGESTERONE ACETATE 150 MG/ML IM SUSP
150.0000 mg | Freq: Once | INTRAMUSCULAR | Status: AC
  Administered 2015-10-27: 150 mg via INTRAMUSCULAR

## 2015-10-27 MED ORDER — MEGESTROL ACETATE 40 MG PO TABS: ORAL_TABLET | ORAL | 1 refills | Status: DC

## 2015-10-27 NOTE — Progress Notes (Signed)
Pt here for Depo. Pt tolerated shot well. Return in 12 weeks for next shot. JSY 

## 2015-11-26 ENCOUNTER — Telehealth: Payer: Self-pay | Admitting: Adult Health

## 2015-11-26 MED ORDER — MEGESTROL ACETATE 40 MG PO TABS: ORAL_TABLET | ORAL | 1 refills | Status: DC

## 2015-11-26 NOTE — Telephone Encounter (Signed)
Spoke with pt. Pt is still having a lot of break through bleeding. She is on Megace 40 mg and wants to know if she can stay on 3 tabs everyday to help control bleeding. Please advise. Thanks!! JSY

## 2015-11-26 NOTE — Telephone Encounter (Signed)
Has bleeding with 2 megace but when on 3, will increase to 3 daily, if continues let me know and we will get [place]

## 2016-01-26 ENCOUNTER — Other Ambulatory Visit: Payer: Self-pay | Admitting: Advanced Practice Midwife

## 2016-01-26 ENCOUNTER — Telehealth: Payer: Self-pay | Admitting: Adult Health

## 2016-01-27 ENCOUNTER — Ambulatory Visit: Payer: BLUE CROSS/BLUE SHIELD

## 2016-01-27 ENCOUNTER — Encounter: Payer: Self-pay | Admitting: *Deleted

## 2016-01-27 ENCOUNTER — Ambulatory Visit (INDEPENDENT_AMBULATORY_CARE_PROVIDER_SITE_OTHER): Payer: BLUE CROSS/BLUE SHIELD | Admitting: *Deleted

## 2016-01-27 DIAGNOSIS — Z3042 Encounter for surveillance of injectable contraceptive: Secondary | ICD-10-CM

## 2016-01-27 DIAGNOSIS — Z3202 Encounter for pregnancy test, result negative: Secondary | ICD-10-CM

## 2016-01-27 DIAGNOSIS — Z308 Encounter for other contraceptive management: Secondary | ICD-10-CM

## 2016-01-27 LAB — POCT URINE PREGNANCY: Preg Test, Ur: NEGATIVE

## 2016-01-27 MED ORDER — MEDROXYPROGESTERONE ACETATE 150 MG/ML IM SUSP
150.0000 mg | INTRAMUSCULAR | 3 refills | Status: DC
Start: 2016-01-27 — End: 2017-11-04

## 2016-01-27 MED ORDER — MEDROXYPROGESTERONE ACETATE 150 MG/ML IM SUSP
150.0000 mg | Freq: Once | INTRAMUSCULAR | Status: AC
  Administered 2016-01-27: 150 mg via INTRAMUSCULAR

## 2016-01-27 NOTE — Progress Notes (Signed)
Pt here for Depo. Pt is 1 day late getting shot. Last sex was 1.5-2 months ago. Pt has frequent spotting/bleeding.  Ok to give shot today. Pt tolerated shot well. Return in 12 weeks for next shot. JSY

## 2016-01-27 NOTE — Telephone Encounter (Signed)
Refilled depo 

## 2016-03-15 ENCOUNTER — Other Ambulatory Visit: Payer: Self-pay | Admitting: Adult Health

## 2016-03-15 MED ORDER — MEGESTROL ACETATE 40 MG PO TABS: ORAL_TABLET | ORAL | 1 refills | Status: DC

## 2016-03-15 NOTE — Telephone Encounter (Signed)
Pt called stating that she would like a refill of her medication megestrol (MEGACE) 40 MG tablet  . Please contact pt

## 2016-04-19 ENCOUNTER — Ambulatory Visit (INDEPENDENT_AMBULATORY_CARE_PROVIDER_SITE_OTHER): Payer: BLUE CROSS/BLUE SHIELD

## 2016-04-19 VITALS — Wt 176.8 lb

## 2016-04-19 DIAGNOSIS — Z3202 Encounter for pregnancy test, result negative: Secondary | ICD-10-CM | POA: Diagnosis not present

## 2016-04-19 DIAGNOSIS — Z3042 Encounter for surveillance of injectable contraceptive: Secondary | ICD-10-CM

## 2016-04-19 LAB — POCT URINE PREGNANCY: Preg Test, Ur: NEGATIVE

## 2016-04-19 MED ORDER — MEDROXYPROGESTERONE ACETATE 150 MG/ML IM SUSP
150.0000 mg | Freq: Once | INTRAMUSCULAR | Status: AC
  Administered 2016-04-19: 150 mg via INTRAMUSCULAR

## 2016-04-19 NOTE — Progress Notes (Signed)
PT here for Depo Shot 150 mg IM given RT Deltoid. Tolerated well. Return 12 week for next shot.P Alichia Alridge CMA 

## 2016-07-12 ENCOUNTER — Ambulatory Visit (INDEPENDENT_AMBULATORY_CARE_PROVIDER_SITE_OTHER): Payer: BLUE CROSS/BLUE SHIELD

## 2016-07-12 DIAGNOSIS — Z3202 Encounter for pregnancy test, result negative: Secondary | ICD-10-CM

## 2016-07-12 DIAGNOSIS — Z3042 Encounter for surveillance of injectable contraceptive: Secondary | ICD-10-CM

## 2016-07-12 LAB — POCT URINE PREGNANCY: PREG TEST UR: NEGATIVE

## 2016-07-12 MED ORDER — MEDROXYPROGESTERONE ACETATE 150 MG/ML IM SUSP
150.0000 mg | Freq: Once | INTRAMUSCULAR | Status: AC
  Administered 2016-07-12: 150 mg via INTRAMUSCULAR

## 2016-07-12 NOTE — Progress Notes (Signed)
PT here for depo shot 150 mg IM given LT Deltoid. Tolerated well.Return 12 weeks for next shot. padCMA 

## 2016-08-05 ENCOUNTER — Other Ambulatory Visit: Payer: Self-pay | Admitting: Adult Health

## 2016-10-04 ENCOUNTER — Ambulatory Visit: Payer: BLUE CROSS/BLUE SHIELD

## 2016-10-05 ENCOUNTER — Ambulatory Visit (INDEPENDENT_AMBULATORY_CARE_PROVIDER_SITE_OTHER): Payer: BLUE CROSS/BLUE SHIELD

## 2016-10-05 VITALS — Ht 61.0 in | Wt 192.0 lb

## 2016-10-05 DIAGNOSIS — Z3202 Encounter for pregnancy test, result negative: Secondary | ICD-10-CM

## 2016-10-05 DIAGNOSIS — Z3042 Encounter for surveillance of injectable contraceptive: Secondary | ICD-10-CM | POA: Diagnosis not present

## 2016-10-05 LAB — POCT URINE PREGNANCY: PREG TEST UR: NEGATIVE

## 2016-10-05 MED ORDER — MEDROXYPROGESTERONE ACETATE 150 MG/ML IM SUSP
150.0000 mg | Freq: Once | INTRAMUSCULAR | Status: AC
  Administered 2016-10-05: 150 mg via INTRAMUSCULAR

## 2016-10-05 NOTE — Progress Notes (Signed)
PT here for depo shot 150 mg IM, given RT Deltoid. Tolerated well. Return 12 week for next injection. Pad CMA

## 2016-12-28 ENCOUNTER — Ambulatory Visit: Payer: BLUE CROSS/BLUE SHIELD

## 2017-02-23 ENCOUNTER — Ambulatory Visit: Payer: BLUE CROSS/BLUE SHIELD | Admitting: Women's Health

## 2017-02-23 ENCOUNTER — Encounter: Payer: Self-pay | Admitting: Women's Health

## 2017-02-23 VITALS — BP 128/90 | HR 94 | Wt 190.3 lb

## 2017-02-23 DIAGNOSIS — Z319 Encounter for procreative management, unspecified: Secondary | ICD-10-CM

## 2017-02-23 DIAGNOSIS — N760 Acute vaginitis: Secondary | ICD-10-CM

## 2017-02-23 DIAGNOSIS — B9689 Other specified bacterial agents as the cause of diseases classified elsewhere: Secondary | ICD-10-CM | POA: Diagnosis not present

## 2017-02-23 DIAGNOSIS — I10 Essential (primary) hypertension: Secondary | ICD-10-CM

## 2017-02-23 DIAGNOSIS — Z113 Encounter for screening for infections with a predominantly sexual mode of transmission: Secondary | ICD-10-CM

## 2017-02-23 DIAGNOSIS — Z8679 Personal history of other diseases of the circulatory system: Secondary | ICD-10-CM | POA: Diagnosis not present

## 2017-02-23 LAB — POCT WET PREP (WET MOUNT)
CLUE CELLS WET PREP WHIFF POC: POSITIVE
Trichomonas Wet Prep HPF POC: ABSENT

## 2017-02-23 MED ORDER — METRONIDAZOLE 500 MG PO TABS: 500.0000 mg | ORAL_TABLET | Freq: Two times a day (BID) | ORAL | 0 refills | Status: DC

## 2017-02-23 NOTE — Progress Notes (Signed)
GYN VISIT Patient name: Audrey Coleman MRN [id number]  Date of birth: 05/11/1975 Chief Complaint:   stdscreening (no discharge)  History of Present Illness:   Audrey Coleman is a 42 y.o. [id number] African American female being seen today for wanting STD screen. New partner w/in last 6 months. No symptoms, just wants to be checked. Has stopped depo, they are trying to conceive. Not taking pnv yet. Does have h/o CHTN, no meds in about a year or so, she had lost some weight and was able to come off norvasc, however states she has put back on some weight. Doesn't smoke, rare etoh, no illicit drugs.      No LMP recorded. The current method of family planning is none. Last pap 09/09/15. Results were:  normal Review of Systems:   Pertinent items are noted in HPI Denies fever/chills, dizziness, headaches, visual disturbances, fatigue, shortness of breath, chest pain, abdominal pain, vomiting, abnormal vaginal discharge/itching/odor/irritation, problems with periods, bowel movements, urination, or intercourse unless otherwise stated above.  Pertinent History Reviewed:  Reviewed past medical,surgical, social, obstetrical and family history.  Reviewed problem list, medications and allergies. Physical Assessment:   Vitals:   02/23/17 1149  BP: 128/90  Pulse: 94  Weight: 190 lb 4.8 oz (86.3 kg)  Body mass index is 35.96 kg/m.       Physical Examination:   General appearance: alert, well appearing, and in no distress  Mental status: alert, oriented to person, place, and time  Skin: warm & dry   Cardiovascular: normal heart rate noted  Respiratory: normal respiratory effort, no distress  Abdomen: soft, non-tender   Pelvic: VULVA: normal appearing vulva with no masses, tenderness or lesions, VAGINA: normal appearing vagina with normal color and slightly malodorous Mariotti discharge, no lesions, CERVIX: normal appearing cervix without discharge or lesions  Extremities: no edema   Results for orders  placed or performed in visit on 02/23/17 (from the past 24 hour(s))  POCT Wet Prep [name]   Collection Time: 02/23/17 12:15 PM  Result Value Ref Range   Source Wet Prep POC vaginal    WBC, Wet Prep HPF POC none    Bacteria Wet Prep HPF POC Few Few   BACTERIA WET PREP MORPHOLOGY POC     Clue Cells Wet Prep HPF POC Moderate (A) None   Clue Cells Wet Prep Whiff POC Positive Whiff    Yeast Wet Prep HPF POC None    KOH Wet Prep POC     Trichomonas Wet Prep HPF POC Absent Absent    Assessment & Plan:  1) STD screen> gc/ct, hiv, rpr, hep b  2) Asymptomatic BV> discussed tx vs no tx, pt wants tx. Rx metronidazole 500mg  BID x 7d for BV, no sex or etoh while taking   3) Trying to conceive> start pnv daily today  4) H/O CHTN> no meds currently, states she has gained weight lately, recommended weight loss/bp control prior to conceiving  Meds:  Meds ordered this encounter  Medications  . metroNIDAZOLE (FLAGYL) 500 MG tablet    Sig: Take 1 tablet (500 mg total) by mouth 2 (two) times daily.    Dispense:  14 tablet    Refill:  0    Order Specific Question:   Supervising Provider    Answer:   Duane LopeEURE, LUTHER H [[id number]]    Orders Placed This Encounter  Procedures  . GC/Chlamydia Probe Amp  . HIV antibody  . RPR  . Hepatitis B surface antigen  .  POCT Wet Prep [facility])    Return in about 1 month (around 03/23/2017) for Physical.  Marge Duncans CNM, [facility] 02/23/2017 12:18 PM

## 2017-02-23 NOTE — Patient Instructions (Addendum)
Start taking a prenatal vitamin daily (with at least 400-863mcg of folic acid)   Bacterial Vaginosis Bacterial vaginosis is a vaginal infection that occurs when the normal balance of bacteria in the vagina is disrupted. It results from an overgrowth of certain bacteria. This is the most common vaginal infection among women ages 60-44. Because bacterial vaginosis increases your risk for STIs (sexually transmitted infections), getting treated can help reduce your risk for chlamydia, gonorrhea, herpes, and HIV (human immunodeficiency virus). Treatment is also important for preventing complications in pregnant women, because this condition can cause an early (premature) delivery. What are the causes? This condition is caused by an increase in harmful bacteria that are normally present in small amounts in the vagina. However, the reason that the condition develops is not fully understood. What increases the risk? The following factors may make you more likely to develop this condition:  Having a new sexual partner or multiple sexual partners.  Having unprotected sex.  Douching.  Having an intrauterine device (IUD).  Smoking.  Drug and alcohol abuse.  Taking certain antibiotic medicines.  Being pregnant.  You cannot get bacterial vaginosis from toilet seats, bedding, swimming pools, or contact with objects around you. What are the signs or symptoms? Symptoms of this condition include:  Grey or [place] vaginal discharge. The discharge can also be watery or foamy.  A fish-like odor with discharge, especially after sexual intercourse or during menstruation.  Itching in and around the vagina.  Burning or pain with urination.  Some women with bacterial vaginosis have no signs or symptoms. How is this diagnosed? This condition is diagnosed based on:  Your medical history.  A physical exam of the vagina.  Testing a sample of vaginal fluid under a microscope to look for a large amount  of bad bacteria or abnormal cells. Your health care provider may use a cotton swab or a small wooden spatula to collect the sample.  How is this treated? This condition is treated with antibiotics. These may be given as a pill, a vaginal cream, or a medicine that is put into the vagina (suppository). If the condition comes back after treatment, a second round of antibiotics may be needed. Follow these instructions at home: Medicines  Take over-the-counter and prescription medicines only as told by your health care provider.  Take or use your antibiotic as told by your health care provider. Do not stop taking or using the antibiotic even if you start to feel better. General instructions  If you have a female sexual partner, tell her that you have a vaginal infection. She should see her health care provider and be treated if she has symptoms. If you have a female sexual partner, he does not need treatment.  During treatment: ? Avoid sexual activity until you finish treatment. ? Do not douche. ? Avoid alcohol as directed by your health care provider. ? Avoid breastfeeding as directed by your health care provider.  Drink enough water and fluids to keep your urine clear or pale yellow.  Keep the area around your vagina and rectum clean. ? Wash the area daily with warm water. ? Wipe yourself from front to back after using the toilet.  Keep all follow-up visits as told by your health care provider. This is important. How is this prevented?  Do not douche.  Wash the outside of your vagina with warm water only.  Use protection when having sex. This includes latex condoms and dental dams.  Limit how many sexual partners  you have. To help prevent bacterial vaginosis, it is best to have sex with just one partner (monogamous).  Make sure you and your sexual partner are tested for STIs.  Wear cotton or cotton-lined underwear.  Avoid wearing tight pants and pantyhose, especially during  summer.  Limit the amount of alcohol that you drink.  Do not use any products that contain nicotine or tobacco, such as cigarettes and e-cigarettes. If you need help quitting, ask your health care provider.  Do not use illegal drugs. Where to find more information:  Centers for Disease Control and Prevention: SolutionApps.co.zawww.cdc.gov/std  American Sexual Health Association (ASHA): www.ashastd.org  U.S. Department of Health and Health and safety inspectorHuman Services, Office on Women's Health: ConventionalMedicines.siwww.womenshealth.gov/ or http://www.anderson-williamson.info/https://www.womenshealth.gov/a-z-topics/bacterial-vaginosis Contact a health care provider if:  Your symptoms do not improve, even after treatment.  You have more discharge or pain when urinating.  You have a fever.  You have pain in your abdomen.  You have pain during sex.  You have vaginal bleeding between periods. Summary  Bacterial vaginosis is a vaginal infection that occurs when the normal balance of bacteria in the vagina is disrupted.  Because bacterial vaginosis increases your risk for STIs (sexually transmitted infections), getting treated can help reduce your risk for chlamydia, gonorrhea, herpes, and HIV (human immunodeficiency virus). Treatment is also important for preventing complications in pregnant women, because the condition can cause an early (premature) delivery.  This condition is treated with antibiotic medicines. These may be given as a pill, a vaginal cream, or a medicine that is put into the vagina (suppository). This information is not intended to replace advice given to you by your health care provider. Make sure you discuss any questions you have with your health care provider. Document Released: 01/04/2005 Document Revised: 05/10/2016 Document Reviewed: 09/20/2015 Elsevier Interactive Patient Education  [employer]2018 Elsevier Inc.

## 2017-02-24 LAB — HIV ANTIBODY (ROUTINE TESTING W REFLEX): HIV Screen 4th Generation wRfx: NONREACTIVE

## 2017-02-24 LAB — HEPATITIS B SURFACE ANTIGEN: HEP B S AG: NEGATIVE

## 2017-02-24 LAB — RPR: RPR Ser Ql: NONREACTIVE

## 2017-02-25 LAB — GC/CHLAMYDIA PROBE AMP
Chlamydia trachomatis, NAA: NEGATIVE
Neisseria gonorrhoeae by PCR: NEGATIVE

## 2017-03-23 ENCOUNTER — Encounter: Payer: Self-pay | Admitting: Women's Health

## 2017-03-23 ENCOUNTER — Ambulatory Visit (INDEPENDENT_AMBULATORY_CARE_PROVIDER_SITE_OTHER): Payer: BLUE CROSS/BLUE SHIELD | Admitting: Women's Health

## 2017-03-23 VITALS — BP 120/80 | HR 94 | Ht 61.0 in | Wt 196.2 lb

## 2017-03-23 DIAGNOSIS — Z3202 Encounter for pregnancy test, result negative: Secondary | ICD-10-CM | POA: Diagnosis not present

## 2017-03-23 DIAGNOSIS — Z6837 Body mass index (BMI) 37.0-37.9, adult: Secondary | ICD-10-CM | POA: Diagnosis not present

## 2017-03-23 DIAGNOSIS — Z3169 Encounter for other general counseling and advice on procreation: Secondary | ICD-10-CM | POA: Diagnosis not present

## 2017-03-23 DIAGNOSIS — Z01411 Encounter for gynecological examination (general) (routine) with abnormal findings: Secondary | ICD-10-CM

## 2017-03-23 DIAGNOSIS — Z01419 Encounter for gynecological examination (general) (routine) without abnormal findings: Secondary | ICD-10-CM

## 2017-03-23 DIAGNOSIS — Z8679 Personal history of other diseases of the circulatory system: Secondary | ICD-10-CM

## 2017-03-23 DIAGNOSIS — E669 Obesity, unspecified: Secondary | ICD-10-CM

## 2017-03-23 LAB — POCT URINE PREGNANCY: PREG TEST UR: NEGATIVE

## 2017-03-23 NOTE — Patient Instructions (Signed)
Call [phone] to schedule your mammogram 

## 2017-03-23 NOTE — Addendum Note (Signed)
Addended by: Federico FlakeNES, PEGGY A on: 03/23/2017 10:04 AM   Modules accepted: Orders

## 2017-03-23 NOTE — Progress Notes (Signed)
   WELL-WOMAN EXAMINATION Patient name: Audrey Coleman MRN [id number]  Date of birth: 03/07/1975 Chief Complaint:   gyn visit (pap/physcial)  History of Present Illness:   Audrey NestCreola J Eshleman is a 42 y.o. [id number] African American female being seen today for a routine well-woman exam.  Current complaints: no period since coming off depo in Sept, was on it for over 2 years. Wants to get pregnant. Taking pnv daily. No etoh/smoking/illicit drug use. Boyfriend is 33yo, he has a 9 & 10yo. Father of pt's other children passed away 2 years ago from massive MI. Pt has h/o CHTN on meds, lost 50lbs started running, was able to come off meds.   PCP: Boyce MediciSam Jackson, [place]      does desire labs, not std screen- just had last month Patient's last menstrual period was 10/03/2016. The current method of family planning is none Last pap 09/09/15. Results were: neg w/ -HRHPV Last mammogram: 2016. Results were: normal Last colonoscopy: never. Results were: n/a  Review of Systems:   Pertinent items are noted in HPI Denies any headaches, blurred vision, fatigue, shortness of breath, chest pain, abdominal pain, abnormal vaginal discharge/itching/odor/irritation, problems with periods, bowel movements, urination, or intercourse unless otherwise stated above. Pertinent History Reviewed:  Reviewed past medical,surgical, social and family history.  Reviewed problem list, medications and allergies. Physical Assessment:   Vitals:   03/23/17 0901  BP: 120/80  Pulse: 94  Weight: 196 lb 3.2 oz (89 kg)  Height: 5\' 1"  (1.549 m)  Body mass index is 37.07 kg/m.        Physical Examination:   General appearance - well appearing, and in no distress  Mental status - alert, oriented to person, place, and time  Psych:  She has a normal mood and affect  Skin - warm and dry, normal color, no suspicious lesions noted  Chest - effort normal, all lung fields clear to auscultation bilaterally  Heart - normal rate and regular  rhythm  Neck:  midline trachea, no thyromegaly or nodules  Breasts - breasts appear normal, no suspicious masses, no skin or nipple changes or  axillary nodes  Abdomen - soft, nondistended, no masses or organomegaly, mild tenderness mid lower abdomen to palpation  Pelvic - VULVA: normal appearing vulva with no masses, tenderness or lesions  VAGINA: normal appearing vagina with normal color and discharge, no lesions  CERVIX: normal appearing cervix without discharge or lesions, no CMT  Thin prep pap is not done  UTERUS: uterus is felt to be normal size, shape, consistency and nontender   ADNEXA: No adnexal masses or tenderness noted.  Extremities:  No swelling or varicosities noted  No results found for this or any previous visit (from the past 24 hour(s)).  Assessment & Plan:  1) Well-Woman Exam  2) Trying to conceive> continue pnv daily, discussed sometimes can take up to a year to begin having periods once off depo  3) H/O CHTN> no meds currently, doing well  Labs/procedures today: cbc, cmp, tsh, a1c, lipid panel  Mammogram-pt to call now to schedule screening mammo  Colonoscopy @45 -50yo, or sooner if problems  Orders Placed This Encounter  Procedures  . CBC  . Comprehensive metabolic panel  . TSH  . Lipid panel  . Hemoglobin A1c    Follow-up: Return in about 1 year (around 03/24/2018) for Pap & physical.  Cheral MarkerKimberly R Prisilla Kocsis CNM, [facility]NP-BC 03/23/2017 9:39 AM

## 2017-03-24 LAB — LIPID PANEL
CHOL/HDL RATIO: 3 ratio (ref 0.0–4.4)
Cholesterol, Total: 123 mg/dL (ref 100–199)
HDL: 41 mg/dL (ref >39)
LDL Calculated: 63 mg/dL (ref 0–99)
Triglycerides: 95 mg/dL (ref 0–149)
VLDL Cholesterol Cal: 19 mg/dL (ref 5–40)

## 2017-03-24 LAB — COMPREHENSIVE METABOLIC PANEL
A/G RATIO: 1.1 — AB (ref 1.2–2.2)
ALBUMIN: 3.4 g/dL — AB (ref 3.5–5.5)
ALK PHOS: 50 IU/L (ref 39–117)
ALT: 37 IU/L — ABNORMAL HIGH (ref 0–32)
AST: 34 IU/L (ref 0–40)
BUN / CREAT RATIO: 12 (ref 9–23)
BUN: 8 mg/dL (ref 6–24)
Bilirubin Total: 0.3 mg/dL (ref 0.0–1.2)
CO2: 23 mmol/L (ref 20–29)
Calcium: 8.6 mg/dL — ABNORMAL LOW (ref 8.7–10.2)
Chloride: 104 mmol/L (ref 96–106)
Creatinine, Ser: 0.67 mg/dL (ref 0.57–1.00)
GFR calc Af Amer: 126 mL/min/{1.73_m2} (ref >59)
GFR calc non Af Amer: 110 mL/min/{1.73_m2} (ref >59)
GLOBULIN, TOTAL: 3.1 g/dL (ref 1.5–4.5)
Glucose: 86 mg/dL (ref 65–99)
Potassium: 3.9 mmol/L (ref 3.5–5.2)
SODIUM: 139 mmol/L (ref 134–144)
Total Protein: 6.5 g/dL (ref 6.0–8.5)

## 2017-03-24 LAB — CBC
HEMOGLOBIN: 13.1 g/dL (ref 11.1–15.9)
Hematocrit: 39.4 % (ref 34.0–46.6)
MCH: 27.5 pg (ref 26.6–33.0)
MCHC: 33.2 g/dL (ref 31.5–35.7)
MCV: 83 fL (ref 79–97)
Platelets: 264 10*3/uL (ref 150–379)
RBC: 4.76 x10E6/uL (ref 3.77–5.28)
RDW: 14.2 % (ref 12.3–15.4)
WBC: 7 10*3/uL (ref 3.4–10.8)

## 2017-03-24 LAB — HEMOGLOBIN A1C
ESTIMATED AVERAGE GLUCOSE: 105 mg/dL
HEMOGLOBIN A1C: 5.3 % (ref 4.8–5.6)

## 2017-03-24 LAB — TSH: TSH: 1.96 u[IU]/mL (ref 0.450–4.500)

## 2017-05-12 ENCOUNTER — Encounter: Payer: Self-pay | Admitting: Adult Health

## 2017-05-12 ENCOUNTER — Ambulatory Visit (INDEPENDENT_AMBULATORY_CARE_PROVIDER_SITE_OTHER): Payer: BLUE CROSS/BLUE SHIELD | Admitting: Adult Health

## 2017-05-12 VITALS — BP 132/80 | HR 74 | Ht 61.0 in | Wt 201.0 lb

## 2017-05-12 DIAGNOSIS — N898 Other specified noninflammatory disorders of vagina: Secondary | ICD-10-CM | POA: Diagnosis not present

## 2017-05-12 DIAGNOSIS — B9689 Other specified bacterial agents as the cause of diseases classified elsewhere: Secondary | ICD-10-CM | POA: Diagnosis not present

## 2017-05-12 DIAGNOSIS — N760 Acute vaginitis: Secondary | ICD-10-CM | POA: Diagnosis not present

## 2017-05-12 LAB — POCT WET PREP (WET MOUNT)
Clue Cells Wet Prep Whiff POC: POSITIVE
WBC WET PREP: POSITIVE

## 2017-05-12 MED ORDER — METRONIDAZOLE 500 MG PO TABS: 500.0000 mg | ORAL_TABLET | Freq: Two times a day (BID) | ORAL | 0 refills | Status: DC

## 2017-05-12 MED ORDER — FLUCONAZOLE 150 MG PO TABS: ORAL_TABLET | ORAL | 1 refills | Status: DC

## 2017-05-12 NOTE — Patient Instructions (Signed)
Bacterial Vaginosis Bacterial vaginosis is a vaginal infection that occurs when the normal balance of bacteria in the vagina is disrupted. It results from an overgrowth of certain bacteria. This is the most common vaginal infection among women ages 15-44. Because bacterial vaginosis increases your risk for STIs (sexually transmitted infections), getting treated can help reduce your risk for chlamydia, gonorrhea, herpes, and HIV (human immunodeficiency virus). Treatment is also important for preventing complications in pregnant women, because this condition can cause an early (premature) delivery. What are the causes? This condition is caused by an increase in harmful bacteria that are normally present in small amounts in the vagina. However, the reason that the condition develops is not fully understood. What increases the risk? The following factors may make you more likely to develop this condition:  Having a new sexual partner or multiple sexual partners.  Having unprotected sex.  Douching.  Having an intrauterine device (IUD).  Smoking.  Drug and alcohol abuse.  Taking certain antibiotic medicines.  Being pregnant.  You cannot get bacterial vaginosis from toilet seats, bedding, swimming pools, or contact with objects around you. What are the signs or symptoms? Symptoms of this condition include:  Grey or [place] vaginal discharge. The discharge can also be watery or foamy.  A fish-like odor with discharge, especially after sexual intercourse or during menstruation.  Itching in and around the vagina.  Burning or pain with urination.  Some women with bacterial vaginosis have no signs or symptoms. How is this diagnosed? This condition is diagnosed based on:  Your medical history.  A physical exam of the vagina.  Testing a sample of vaginal fluid under a microscope to look for a large amount of bad bacteria or abnormal cells. Your health care provider may use a cotton swab  or a small wooden spatula to collect the sample.  How is this treated? This condition is treated with antibiotics. These may be given as a pill, a vaginal cream, or a medicine that is put into the vagina (suppository). If the condition comes back after treatment, a second round of antibiotics may be needed. Follow these instructions at home: Medicines  Take over-the-counter and prescription medicines only as told by your health care provider.  Take or use your antibiotic as told by your health care provider. Do not stop taking or using the antibiotic even if you start to feel better. General instructions  If you have a female sexual partner, tell her that you have a vaginal infection. She should see her health care provider and be treated if she has symptoms. If you have a female sexual partner, he does not need treatment.  During treatment: ? Avoid sexual activity until you finish treatment. ? Do not douche. ? Avoid alcohol as directed by your health care provider. ? Avoid breastfeeding as directed by your health care provider.  Drink enough water and fluids to keep your urine clear or pale yellow.  Keep the area around your vagina and rectum clean. ? Wash the area daily with warm water. ? Wipe yourself from front to back after using the toilet.  Keep all follow-up visits as told by your health care provider. This is important. How is this prevented?  Do not douche.  Wash the outside of your vagina with warm water only.  Use protection when having sex. This includes latex condoms and dental dams.  Limit how many sexual partners you have. To help prevent bacterial vaginosis, it is best to have sex with just   one partner (monogamous).  Make sure you and your sexual partner are tested for STIs.  Wear cotton or cotton-lined underwear.  Avoid wearing tight pants and pantyhose, especially during summer.  Limit the amount of alcohol that you drink.  Do not use any products that  contain nicotine or tobacco, such as cigarettes and e-cigarettes. If you need help quitting, ask your health care provider.  Do not use illegal drugs. Where to find more information:  Centers for Disease Control and Prevention: www.cdc.gov/std  American Sexual Health Association (ASHA): www.ashastd.org  U.S. Department of Health and Human Services, Office on Women's Health: www.womenshealth.gov/ or https://www.womenshealth.gov/a-z-topics/bacterial-vaginosis Contact a health care provider if:  Your symptoms do not improve, even after treatment.  You have more discharge or pain when urinating.  You have a fever.  You have pain in your abdomen.  You have pain during sex.  You have vaginal bleeding between periods. Summary  Bacterial vaginosis is a vaginal infection that occurs when the normal balance of bacteria in the vagina is disrupted.  Because bacterial vaginosis increases your risk for STIs (sexually transmitted infections), getting treated can help reduce your risk for chlamydia, gonorrhea, herpes, and HIV (human immunodeficiency virus). Treatment is also important for preventing complications in pregnant women, because the condition can cause an early (premature) delivery.  This condition is treated with antibiotic medicines. These may be given as a pill, a vaginal cream, or a medicine that is put into the vagina (suppository). This information is not intended to replace advice given to you by your health care provider. Make sure you discuss any questions you have with your health care provider. Document Released: 01/04/2005 Document Revised: 05/10/2016 Document Reviewed: 09/20/2015 Elsevier Interactive Patient Education  2018 Elsevier Inc.  

## 2017-05-12 NOTE — Progress Notes (Signed)
Subjective:     Patient ID: Audrey Coleman, female   DOB: 12/02/1975, 42 y.o.   MRN: [id number]  HPI Audrey Coleman is a 42 year old black female in complaining of [place] vaginal discharge with slight odor. Denies any itching or burning.  Review of Systems Vaginal discharge, slight odor Denies any itching or burning  Reviewed past medical,surgical, social and family history. Reviewed medications and allergies.     Objective:   Physical Exam BP 132/80 (BP Location: Right Arm, Patient Position: Sitting, Cuff Size: Normal)   Pulse 74   Ht 5\' 1"  (1.549 m)   Wt 201 lb (91.2 kg)   LMP 05/03/2017 (Exact Date)   BMI 37.98 kg/m   Skin warm and dry.Pelvic: external genitalia is normal in appearance no lesions, vagina: Diez discharge with odor,urethra has no lesions or masses noted, cervix:smooth and bulbous, uterus: normal size, shape and contour, non tender, no masses felt, adnexa: no masses or tenderness noted. Bladder is non tender and no masses felt. Wet prep: + for clue cells and +WBCs. Nuswab obtained.     Assessment:       ICD-10-CM   1. BV (bacterial vaginosis) N76.0 POCT Wet Prep [facility] [place])   B96.89   2. Vaginal discharge N89.8 NuSwab Vaginitis Plus (VG+)    POCT Wet Prep [name]      Plan:    Nuswab sent Meds ordered this encounter  Medications  . metroNIDAZOLE (FLAGYL) 500 MG tablet    Sig: Take 1 tablet (500 mg total) by mouth 2 (two) times daily.    Dispense:  14 tablet    Refill:  0    Order Specific Question:   Supervising Provider    Answer:   Despina HiddenEURE, LUTHER H [[id number]]  . fluconazole (DIFLUCAN) 150 MG tablet    Sig: Take 1 now and 1 in 3 days    Dispense:  2 tablet    Refill:  1    Order Specific Question:   Supervising Provider    Answer:   Lazaro ArmsEURE, LUTHER H [[id number]]  Review handout on BV No alcohol or sex during treatment No tub bath,try bar soap F/U prn

## 2017-05-16 LAB — NUSWAB VAGINITIS PLUS (VG+)
ATOPOBIUM VAGINAE: HIGH {score} — AB
BVAB 2: HIGH Score — AB
CANDIDA GLABRATA, NAA: NEGATIVE
CHLAMYDIA TRACHOMATIS, NAA: NEGATIVE
Candida albicans, NAA: NEGATIVE
Megasphaera 1: HIGH Score — AB
NEISSERIA GONORRHOEAE, NAA: NEGATIVE
TRICH VAG BY NAA: NEGATIVE

## 2017-11-04 ENCOUNTER — Encounter: Payer: Self-pay | Admitting: Adult Health

## 2017-11-04 ENCOUNTER — Ambulatory Visit: Payer: BLUE CROSS/BLUE SHIELD | Admitting: Adult Health

## 2017-11-04 VITALS — BP 146/99 | HR 77 | Ht 61.0 in | Wt 192.6 lb

## 2017-11-04 DIAGNOSIS — N926 Irregular menstruation, unspecified: Secondary | ICD-10-CM | POA: Diagnosis not present

## 2017-11-04 DIAGNOSIS — Z113 Encounter for screening for infections with a predominantly sexual mode of transmission: Secondary | ICD-10-CM | POA: Diagnosis not present

## 2017-11-04 DIAGNOSIS — N898 Other specified noninflammatory disorders of vagina: Secondary | ICD-10-CM | POA: Diagnosis not present

## 2017-11-04 LAB — POCT WET PREP (WET MOUNT)
Clue Cells Wet Prep Whiff POC: NEGATIVE
WBC, Wet Prep HPF POC: POSITIVE

## 2017-11-04 LAB — POCT URINE PREGNANCY: Preg Test, Ur: NEGATIVE

## 2017-11-04 MED ORDER — METRONIDAZOLE 0.75 % VA GEL: 1.0000 | Freq: Two times a day (BID) | VAGINAL | 0 refills | Status: DC

## 2017-11-04 NOTE — Progress Notes (Addendum)
  Subjective:     Patient ID: Audrey Coleman, female   DOB: 27-Nov-1975, 42 y.o.   MRN: [id number]  HPI Audrey Coleman is a 42 year old black female female complaining of brownish vaginal discharge with occasional itch and some soreness in vagina.  Review of Systems +brownish discharge Occasional itch Soreness in vagina  Has missed a period, too Same partner for over a year Reviewed past medical,surgical, social and family history. Reviewed medications and allergies.     Objective:   Physical Exam BP (!) 146/99 (BP Location: Right Arm, Patient Position: Sitting, Cuff Size: Normal)   Pulse 77   Ht 5\' 1"  (1.549 m)   Wt 192 lb 9.6 oz (87.4 kg)   LMP 09/23/2017   BMI 36.39 kg/m UPT negative. Skin warm and dry.Pelvic: external genitalia is normal in appearance no lesions, vagina: bloody discharge without odor,urethra has no lesions or masses noted, cervix:smooth and bulbous, uterus: normal size, shape and contour, non tender, no masses felt, adnexa: no masses or tenderness noted. Bladder is non tender and no masses felt. Wet prep: + for clue cells and +WBCs. GC/CHL obtained.  Examination chaperoned by Federico Flake CMA    Assessment:     1. Vaginal discharge, bloody   2. Screening examination for STD (sexually transmitted disease)   3. Missed period       Plan:     GC/CHL sent Meds ordered this encounter  Medications  . metroNIDAZOLE (METROGEL) 0.75 % vaginal gel    Sig: Place 1 Applicatorful vaginally 2 (two) times daily.    Dispense:  70 g    Refill:  0    Order Specific Question:   Supervising Provider    Answer:   Duane Lope H [[id number]]  No sex for during treatment Take OTC PNV since not on birth control  F.u with me

## 2017-11-08 LAB — GC/CHLAMYDIA PROBE AMP
CHLAMYDIA, DNA PROBE: NEGATIVE
NEISSERIA GONORRHOEAE BY PCR: NEGATIVE

## 2018-01-26 DIAGNOSIS — Z6841 Body Mass Index (BMI) 40.0 and over, adult: Secondary | ICD-10-CM | POA: Diagnosis not present

## 2018-01-26 DIAGNOSIS — R030 Elevated blood-pressure reading, without diagnosis of hypertension: Secondary | ICD-10-CM | POA: Diagnosis not present

## 2018-01-26 DIAGNOSIS — Z1389 Encounter for screening for other disorder: Secondary | ICD-10-CM | POA: Diagnosis not present

## 2018-05-26 DIAGNOSIS — I10 Essential (primary) hypertension: Secondary | ICD-10-CM | POA: Diagnosis not present

## 2018-05-26 DIAGNOSIS — Z0001 Encounter for general adult medical examination with abnormal findings: Secondary | ICD-10-CM | POA: Diagnosis not present

## 2018-05-26 DIAGNOSIS — Z6841 Body Mass Index (BMI) 40.0 and over, adult: Secondary | ICD-10-CM | POA: Diagnosis not present

## 2018-05-26 DIAGNOSIS — Z1389 Encounter for screening for other disorder: Secondary | ICD-10-CM | POA: Diagnosis not present

## 2018-08-18 DIAGNOSIS — Z1389 Encounter for screening for other disorder: Secondary | ICD-10-CM | POA: Diagnosis not present

## 2018-08-18 DIAGNOSIS — Z6841 Body Mass Index (BMI) 40.0 and over, adult: Secondary | ICD-10-CM | POA: Diagnosis not present

## 2018-08-18 DIAGNOSIS — I10 Essential (primary) hypertension: Secondary | ICD-10-CM | POA: Diagnosis not present

## 2018-08-24 ENCOUNTER — Other Ambulatory Visit (HOSPITAL_COMMUNITY): Payer: Self-pay | Admitting: Family Medicine

## 2018-08-24 DIAGNOSIS — Z1231 Encounter for screening mammogram for malignant neoplasm of breast: Secondary | ICD-10-CM

## 2018-09-14 DIAGNOSIS — I10 Essential (primary) hypertension: Secondary | ICD-10-CM | POA: Diagnosis not present

## 2018-10-05 ENCOUNTER — Other Ambulatory Visit: Payer: Self-pay

## 2018-10-05 ENCOUNTER — Encounter: Payer: BC Managed Care – PPO | Attending: General Surgery | Admitting: Dietician

## 2018-10-05 DIAGNOSIS — E669 Obesity, unspecified: Secondary | ICD-10-CM | POA: Insufficient documentation

## 2018-10-05 NOTE — Patient Instructions (Signed)
Start working through the [employer] discussed today, starting with the following:   Look for a liquid protein source that contains ?15 g protein and ?5 g carbohydrate (ex: shakes, drinks, shots)

## 2018-10-05 NOTE — Progress Notes (Signed)
Bariatric Pre-Op Nutrition Assessment Medical Nutrition Therapy  Appt Start Time: 11:05am     End time: 11:50am  Patient was seen on 10/05/2018 for Pre-Operative Nutrition Assessment. Assessment and letter of approval faxed to [facility] Surgery Bariatric Surgery Program coordinator on 10/05/2018.   Planned surgery: Sleeve Gastrectomy  Pt expectation of surgery: to maintain weight lost, would like to weigh 140 lbs  Pt expectation of dietitian: none stated   Anthropometrics  Start weight at [facility]: 217.2 lbs (date: 10/05/2018) Height: 61 in BMI: 41 kg/m2    Pt states she worries about losing too much weight over the next 6 months so that it disqualifies her from having surgery.   Clinical  Medical Hx: obesity, HTN Medications: Saxenda  Psychosocial/Lifestyle Works for [employer], may work 1st, 2nd, or 3rd shifts throughout the week so schedule varies a lot. On her feet all day, totaling ~15,000 steps/day and otherwise doesn't do other physical activity throughout the week. Lives with her boyfriend and her 2 youngest children. Excited for surgery, states she knows a couple friends who have had this surgery.    24-Hr Dietary Recall First Meal: usually skips  Snack: usually skips  Second Meal: pizza (or burger, or other fast food)  Snack: fried chicken patty  Third Meal: protein shake Snack: usually skips Beverages: Coke, water, black coffee   Food & Nutrition Related Hx Dietary Hx: 2 meals + 1 snack per day. Eats out a lot, usually 2 meals/day are fast food or from a restaurant. Usually skips breakfast. Meal pattern varies depending on work schedule that day. Drinks lots of water/ fluids, including 2 bottles of Coke daily. States she loves coffee and drinks it black.   Estimated Daily Fluid Intake: 80+ oz Supplements: none  GI / Other Notable Symptoms: constipation   Physical Activity  Current average weekly physical activity: ~15,000 steps/day (walking/on feet at work)    Estimated Energy Needs Calories: 1800 Carbohydrate: 200g Protein: 113g Fat: 60g  Pre-Op Goals Reviewed with the Patient . Track food and beverage intake (pen and paper, MyFitness Pal, Baritastic app, etc.) . Make healthy food choices while monitoring portion sizes . Consume 3 meals per day or try to eat every 3-5 hours . Avoid concentrated sugars and fried foods . Keep sugar & fat in the single digits per serving on food labels . Practice CHEWING your food (aim for applesauce consistency) . Practice not drinking 15 minutes before, during, and 30 minutes after each meal and snack . Avoid all carbonated beverages (ex: soda, sparkling beverages)  . Limit caffeinated beverages (ex: coffee, tea, energy drinks) . Avoid all sugar-sweetened beverages (ex: regular soda, sports drinks)  . Avoid alcohol  . Aim for 64-100 ounces of FLUID daily (with at least half of fluid intake being plain water)  . Aim for at least 60-80 grams of PROTEIN daily . Look for a liquid protein source that contains ?15 g protein and ?5 g carbohydrate (ex: shakes, drinks, shots) . Make a list of non-food related activities . Physical activity is an important part of a healthy lifestyle so keep it moving! The goal is to reach 150 minutes of exercise per week, including cardiovascular and weight baring activity.  *Goals that are bolded indicate the pt would like to start working towards these  Handouts Provided Include  . Bariatric Surgery handouts (Nutrition Visits, Pre-Op Goals, Protein Shakes, Vitamins & Minerals)  Learning Style & Readiness for Change Teaching method utilized: Visual & Auditory  Demonstrated degree of  understanding via: Teach Back  Barriers to learning/adherence to lifestyle change: None Identified   RD's Notes for Next Visit . 3 Meals/Day   Next Steps Supervised Weight Loss (SWL) Visits Needed: 6  Patient is to return to [facility] in 1 month for 1st SWL Visit.  Patient is to call [facility] to  enroll in Pre-Op Class (>2 weeks before surgery) and Post-Op Class (2 weeks after surgery) for further nutrition education when surgery date is scheduled.

## 2018-11-07 ENCOUNTER — Encounter: Payer: BC Managed Care – PPO | Attending: General Surgery | Admitting: Dietician

## 2018-11-07 ENCOUNTER — Other Ambulatory Visit: Payer: Self-pay

## 2018-11-07 DIAGNOSIS — E669 Obesity, unspecified: Secondary | ICD-10-CM | POA: Diagnosis not present

## 2018-11-07 NOTE — Progress Notes (Signed)
Supervised Weight Loss Visit Bariatric Nutrition Education Appt Start Time: 2:05pm    End Time: 2:20pm  Planned Surgery: Sleeve Gastrectomy   Pt Expectation of Surgery/ Goals: to maintain weight lost, would like to weigh about 140 lbs   1st out of 6 SWL Appointments    NUTRITION ASSESSMENT  Anthropometrics  Start weight at [facility]: 217.2 lbs (date: 10/05/2018) Today's weight: 210.5 lbs Weight change: -6.7 lbs (since previous visit on 10/05/2018) BMI: 39.8 kg/m2    Pt states she worries about losing too much weight so that it disqualifies her from having surgery. States her insurance does not accept her diagnosis of hypertension as a co-morbidity because it is well-managed, so her BMI must be above 40 to qualify.   Clinical  Medical Hx: obesity, HTN Medications: Saxenda  Lifestyle & Dietary Hx Works for [employer], may work 1st, 2nd, or 3rd shifts throughout the week so schedule varies a lot. On her feet all day, totaling ~15,000 steps/day and otherwise doesn't do other physical activity throughout the week. Lives with her boyfriend and her 2 youngest children. Excited for surgery, states she knows a couple friends who have had this surgery.   States she typically eats either 3 meals or 2 meals and maybe 1 snack per day. Eats out a lot, has a busy work schedule. Has switched from regular to diet soda! Estimated daily fluid intake is 80+ oz. States she is concerned about incorporating new habits to prepare for surgery but losing too much weight. We discussed healthful ways to increase caloric intake while still applying dietary habits and goals to prepare for surgery.   24-Hr Dietary Recall First Meal: sausage (or biscuit)  Snack: - Second Meal: grab something quick (or frozen meal)  Snack: - Third Meal: teriyaki chicken + potatoes  Snack: - Beverages: water, diet soda, Mio   Estimated Energy Needs Calories: 1800 Carbohydrate: 200g Protein: 113g Fat: 60g   NUTRITION DIAGNOSIS   Overweight/obesity (-3.3) related to past poor dietary habits and physical inactivity as evidenced by patient w/ planned Sleeve Gastrectomy surgery following dietary guidelines for continued weight loss.   NUTRITION INTERVENTION  Nutrition counseling (C-1) and education (E-2) to facilitate bariatric surgery goals.  Pre-Op Goals Progress & New Goals . Has not found a protein shake she likes yet  . Working on eating at least 3 meals/day  . Physically active  . Working on consuming adequate calories to maintain weight (not fall below 40 BMI)   Handouts Provided Include   High Calorie High Protein Nutrition Therapy   Learning Style & Readiness for Change Teaching method utilized: Visual & Auditory  Demonstrated degree of understanding via: Teach Back  Barriers to learning/adherence to lifestyle change: None Identified   RD's Notes for next Visit  . Chewing and not drinking with meals    MONITORING & EVALUATION Dietary intake, weekly physical activity, body weight, and pre-op goals in 1 month.   Next Steps  Patient is to return to [facility] in 1 month for 2nd SWL visit.

## 2018-11-08 ENCOUNTER — Encounter: Payer: Self-pay | Admitting: Dietician

## 2018-12-01 ENCOUNTER — Other Ambulatory Visit (HOSPITAL_COMMUNITY)
Admission: RE | Admit: 2018-12-01 | Discharge: 2018-12-01 | Disposition: A | Discharge status: Home / Self Care | Payer: BC Managed Care – PPO | Source: Ambulatory Visit | Attending: Obstetrics & Gynecology | Admitting: Obstetrics & Gynecology

## 2018-12-01 ENCOUNTER — Other Ambulatory Visit (INDEPENDENT_AMBULATORY_CARE_PROVIDER_SITE_OTHER): Payer: BC Managed Care – PPO | Admitting: *Deleted

## 2018-12-01 ENCOUNTER — Other Ambulatory Visit: Payer: Self-pay

## 2018-12-01 ENCOUNTER — Encounter: Payer: Self-pay | Admitting: *Deleted

## 2018-12-01 DIAGNOSIS — N898 Other specified noninflammatory disorders of vagina: Secondary | ICD-10-CM

## 2018-12-01 NOTE — Progress Notes (Signed)
   NURSE VISIT- VAGINITIS/STD  SUBJECTIVE:  Audrey Coleman is a 43 y.o. [phone] GYN patient female here for a vaginal swab for vaginitis screening.  She reports the following symptoms: discharge described as [place] for 3 days. Denies abnormal vaginal bleeding, significant pelvic pain, fever, or UTI symptoms.  OBJECTIVE:  LMP 11/18/2018   Appears well, in no apparent distress  ASSESSMENT: Vaginal swab for vaginitis screening  PLAN: Self-collected vaginal probe for Gonorrhea, Chlamydia, Trichomonas, Bacterial Vaginosis, Yeast sent to lab Treatment: to be determined once results are received Follow-up as needed if symptoms persist/worsen, or new symptoms develop  Alice Rieger  12/01/2018 9:05 AM

## 2018-12-01 NOTE — Progress Notes (Signed)
Chart reviewed for nurse visit. Agree with plan of care.  Estill Dooms, NP 12/01/2018 12:14 PM

## 2018-12-04 LAB — CERVICOVAGINAL ANCILLARY ONLY
Bacterial Vaginitis (gardnerella): POSITIVE — AB
Candida Glabrata: NEGATIVE
Candida Vaginitis: POSITIVE — AB
Chlamydia: NEGATIVE
Comment: NEGATIVE
Comment: NEGATIVE
Comment: NEGATIVE
Comment: NEGATIVE
Comment: NEGATIVE
Comment: NORMAL
Neisseria Gonorrhea: NEGATIVE
Trichomonas: NEGATIVE

## 2018-12-05 ENCOUNTER — Other Ambulatory Visit: Payer: Self-pay | Admitting: Adult Health

## 2018-12-05 MED ORDER — METRONIDAZOLE 500 MG PO TABS
500.0000 mg | ORAL_TABLET | Freq: Two times a day (BID) | ORAL | 0 refills | Status: DC
Start: 2018-12-05 — End: 2019-04-11

## 2018-12-05 MED ORDER — FLUCONAZOLE 150 MG PO TABS: ORAL_TABLET | ORAL | 1 refills | Status: DC

## 2018-12-05 NOTE — Progress Notes (Signed)
+  BV and yeast on CV swab,will rx flagyl and diflucan 

## 2018-12-07 ENCOUNTER — Encounter: Payer: BC Managed Care – PPO | Attending: General Surgery | Admitting: Dietician

## 2018-12-07 ENCOUNTER — Other Ambulatory Visit: Payer: Self-pay

## 2018-12-07 ENCOUNTER — Encounter: Payer: Self-pay | Admitting: Dietician

## 2018-12-07 DIAGNOSIS — E669 Obesity, unspecified: Secondary | ICD-10-CM

## 2018-12-07 NOTE — Progress Notes (Signed)
Supervised Weight Loss Visit Bariatric Nutrition Education Appt Start Time: 10:10am   End Time: 10:25am  Planned Surgery: Sleeve Gastrectomy   Pt Expectation of Surgery/ Goals: to maintain weight lost, would like to weigh about 140 lbs   2nd out of 6 SWL Appointments    NUTRITION ASSESSMENT  Anthropometrics  Start weight at [facility]: 217.2 lbs (date: 10/05/2018) Today's weight: 212 lbs Weight change: +1.5 lbs (since previous visit on 11/07/2018) BMI: 40 kg/m2    Pt states she worries about losing too much weight so that it disqualifies her from having surgery. States her insurance does not accept her diagnosis of hypertension as a co-morbidity because it is well-managed, so her BMI must be above 40 to qualify.   Clinical  Medical Hx: obesity, HTN Medications: Saxenda  Lifestyle & Dietary Hx Works for [employer], may work 1st, 2nd, or 3rd shifts throughout the week so schedule varies a lot. On her feet all day, totaling ~15,000 steps/day and otherwise doesn't do other physical activity throughout the week. Lives with her boyfriend and her 2 youngest children. Excited for surgery, states she knows a couple friends who have had this surgery.   Estimated daily fluid intake is 80+ oz. Getting better at eating more frequently throughout the day, especially by incorporating breakfast. [employer] Protein shakes.   24-Hr Dietary Recall First Meal: eggs + ham Snack: - Second Meal: grab something quick   Snack: - Third Meal: sal[place]bury steak + potatoes + green beans  Snack: dry cereal  Beverages: water, diet soda, Mio   Estimated Energy Needs Calories: 1800 Carbohydrate: 200g Protein: 113g Fat: 60g   NUTRITION DIAGNOSIS  Overweight/obesity ([place]-3.3) related to past poor dietary habits and physical inactivity as evidenced by patient w/ planned Sleeve Gastrectomy surgery following dietary guidelines for continued weight loss.   NUTRITION INTERVENTION  Nutrition counseling (C-1) and  education (E-2) to facilitate bariatric surgery goals.  Pre-Op Goals Progress & New Goals . Found a protein shake she likes . Working on eating at least 3 meals/day  . Physically active  . Working on consuming adequate calories to maintain weight (not fall below 40 BMI)  . NEW: Chew food thoroughly  . NEW: Not drinking with meals   Learning Style & Readiness for Change Teaching method utilized: Visual & Auditory  Demonstrated degree of understanding via: Teach Back  Barriers to learning/adherence to lifestyle change: None Identified   RD's Notes for next Visit  . Track food and beverage intake     MONITORING & EVALUATION Dietary intake, weekly physical activity, body weight, and pre-op goals in 1 month.   Next Steps  Patient is to return to [facility] in 1 month for 3rd SWL visit.

## 2018-12-27 ENCOUNTER — Encounter (HOSPITAL_COMMUNITY): Payer: Self-pay

## 2018-12-27 ENCOUNTER — Ambulatory Visit (HOSPITAL_COMMUNITY): Admission: RE | Admit: 2018-12-27 | Payer: BC Managed Care – PPO | Source: Ambulatory Visit

## 2018-12-27 ENCOUNTER — Other Ambulatory Visit: Payer: Self-pay

## 2018-12-27 ENCOUNTER — Ambulatory Visit (HOSPITAL_COMMUNITY)
Admission: RE | Admit: 2018-12-27 | Discharge: 2018-12-27 | Disposition: A | Discharge status: Home / Self Care | Payer: BC Managed Care – PPO | Source: Ambulatory Visit | Attending: General Surgery | Admitting: General Surgery

## 2018-12-27 ENCOUNTER — Other Ambulatory Visit (HOSPITAL_COMMUNITY): Payer: Self-pay | Admitting: General Surgery

## 2018-12-27 ENCOUNTER — Other Ambulatory Visit: Payer: Self-pay | Admitting: General Surgery

## 2018-12-27 DIAGNOSIS — Z9884 Bariatric surgery status: Secondary | ICD-10-CM

## 2018-12-27 DIAGNOSIS — Z01818 Encounter for other preprocedural examination: Secondary | ICD-10-CM | POA: Diagnosis not present

## 2019-01-10 ENCOUNTER — Encounter: Payer: Self-pay | Admitting: Dietician

## 2019-01-10 ENCOUNTER — Encounter: Payer: BC Managed Care – PPO | Attending: General Surgery | Admitting: Dietician

## 2019-01-10 ENCOUNTER — Other Ambulatory Visit: Payer: Self-pay

## 2019-01-10 DIAGNOSIS — E669 Obesity, unspecified: Secondary | ICD-10-CM | POA: Diagnosis not present

## 2019-01-10 NOTE — Progress Notes (Signed)
Supervised Weight Loss Visit Bariatric Nutrition Education Appt Start Time: 9:10am   End Time: 9:25am  Planned Surgery: Sleeve Gastrectomy   Pt Expectation of Surgery/ Goals: to maintain weight lost, would like to weigh about 140 lbs   3rd out of 6 SWL Appointments    NUTRITION ASSESSMENT  Anthropometrics  Start weight at [facility]: 217.2 lbs (date: 10/05/2018) Today's weight: 216.5 lbs Weight change: +4.5 lbs (since previous visit on 12/07/2018) BMI: 40.9 kg/m2    Pt states she worries about losing too much weight so that it disqualifies her from having surgery. States her insurance does not accept her diagnosis of hypertension as a co-morbidity because it is well-managed, so her BMI must be above 40 to qualify.   Clinical  Medical Hx: obesity, HTN Medications: Saxenda  Lifestyle & Dietary Hx Works for [employer], may work 1st, 2nd, or 3rd shifts throughout the week so schedule varies a lot. On her feet all day, totaling ~15,000 steps/day. Excited for surgery, states she knows a couple friends who have had this surgery.   Estimated daily fluid intake is 80+ oz. Likes Premier Protein and Muscle Milk protein shakes. Typical meal pattern is 2 meals per day, usually does not eat at work. Has cut out carbonated beverages completely. Working on chewing and not drinking with meals.   24-Hr Dietary Recall First Meal: cereal Snack: - Second Meal: -   Snack: - Third Meal: ribs + fries + broccoli  Snack: dry cereal  Beverages: water, Mio   Estimated Energy Needs Calories: 1800 Carbohydrate: 200g Protein: 113g Fat: 60g   NUTRITION DIAGNOSIS  Overweight/obesity ([place]-3.3) related to past poor dietary habits and physical inactivity as evidenced by patient w/ planned Sleeve Gastrectomy surgery following dietary guidelines for continued weight loss.   NUTRITION INTERVENTION  Nutrition counseling (C-1) and education (E-2) to facilitate bariatric surgery goals.  Pre-Op Goals Progress & New  Goals . Found appropriate protein shakes  . Working on eating at least 3 meals/day  . Physically active   . Working on consuming adequate calories to maintain weight (not fall below 40 BMI)  . Chewing food thoroughly  . Working on not drinking with meals  . No carbonated beverages  . NEW: Practice tracking food intake to ensure adequate protein intake (60g/day)   Learning Style & Readiness for Change Teaching method utilized: Visual & Auditory  Demonstrated degree of understanding via: Teach Back  Barriers to learning/adherence to lifestyle change: None Identified    MONITORING & EVALUATION Dietary intake, weekly physical activity, body weight, and pre-op goals in 1 month.   Next Steps  Patient is to return to [facility] in 1 month for 4th SWL visit.

## 2019-01-10 NOTE — Patient Instructions (Signed)
Great job with cutting out sodas! Continue to drink lots of fluids, chew foods thoroughly, and work on not drinking with meals/snacks. Remember your new goal for this month:  - Practice tracking food/beverage intake to ensure meeting protein goal (60+ grams/day)

## 2019-02-05 ENCOUNTER — Ambulatory Visit (INDEPENDENT_AMBULATORY_CARE_PROVIDER_SITE_OTHER): Payer: BC Managed Care – PPO | Admitting: Psychology

## 2019-02-05 DIAGNOSIS — F509 Eating disorder, unspecified: Secondary | ICD-10-CM | POA: Diagnosis not present

## 2019-02-06 ENCOUNTER — Encounter: Payer: Self-pay | Admitting: Dietician

## 2019-02-06 ENCOUNTER — Encounter: Payer: BC Managed Care – PPO | Attending: General Surgery | Admitting: Dietician

## 2019-02-06 ENCOUNTER — Other Ambulatory Visit: Payer: Self-pay

## 2019-02-06 DIAGNOSIS — E669 Obesity, unspecified: Secondary | ICD-10-CM | POA: Insufficient documentation

## 2019-02-06 NOTE — Progress Notes (Signed)
Supervised Weight Loss Visit Bariatric Nutrition Education Appt Start Time: 9:00am   End Time: 9:15am  Planned Surgery: Sleeve Gastrectomy   Pt Expectation of Surgery/ Goals: to maintain weight lost, would like to weigh about 140 lbs   4th out of 6 SWL Appointments    NUTRITION ASSESSMENT  Anthropometrics  Start weight at [facility]: 217.2 lbs (date: 10/05/2018) Today's weight: 220 lbs BMI: 41.6 kg/m2    Pt states she worries about losing too much weight so that it disqualifies her from having surgery. States her insurance does not accept her diagnosis of hypertension as a co-morbidity because it is well-managed, so her BMI must be above 40 to qualify.   Lifestyle & Dietary Hx Works for [employer], may work 1st, 2nd, or 3rd shifts throughout the week so schedule varies a lot.  Estimated daily fluid intake is 100+ oz. States it has been difficult for her to eat a breakfast meal because she does not have an appetite for it, but wants to try and incorporate small meals more frequently throughout the day. Typical meal pattern is 2 meals per day, usually does not eat at work. Has cut out carbonated beverages completely. Working on chewing and not drinking with meals.   24-Hr Dietary Recall First Meal: - Snack: - Second Meal: -   Snack: - Third Meal: ribs + fries + broccoli  Snack: dry cereal  Beverages: water, Mio   Estimated Energy Needs Calories: 1800 Carbohydrate: 200g Protein: 113g Fat: 60g   NUTRITION DIAGNOSIS  Overweight/obesity ([place]-3.3) related to past poor dietary habits and physical inactivity as evidenced by patient w/ planned Sleeve Gastrectomy surgery following dietary guidelines for continued weight loss.   NUTRITION INTERVENTION  Nutrition counseling (C-1) and education (E-2) to facilitate bariatric surgery goals.  Pre-Op Goals Progress & New Goals . Found appropriate protein shakes  . Working on eating at least 3 meals/day  . Physically active   . Working on  consuming adequate calories to maintain weight (not fall below 40 BMI)  . Chewing food thoroughly  . Working on not drinking with meals  . No carbonated beverages  . NEW: Practice tracking food intake to ensure adequate protein intake (60g/day)   Learning Style & Readiness for Change Teaching method utilized: Visual & Auditory  Demonstrated degree of understanding via: Teach Back  Barriers to learning/adherence to lifestyle change: None Identified    MONITORING & EVALUATION Dietary intake, weekly physical activity, body weight, and pre-op goals in 1 month.   Next Steps  Patient is to return to [facility] in 1 month for 5th SWL visit.

## 2019-02-19 ENCOUNTER — Ambulatory Visit: Payer: BC Managed Care – PPO | Admitting: Psychology

## 2019-02-28 DIAGNOSIS — I10 Essential (primary) hypertension: Secondary | ICD-10-CM | POA: Diagnosis not present

## 2019-03-05 ENCOUNTER — Encounter: Payer: BC Managed Care – PPO | Attending: General Surgery | Admitting: Skilled Nursing Facility1

## 2019-03-05 ENCOUNTER — Other Ambulatory Visit: Payer: Self-pay

## 2019-03-05 DIAGNOSIS — E669 Obesity, unspecified: Secondary | ICD-10-CM | POA: Insufficient documentation

## 2019-03-05 NOTE — Progress Notes (Signed)
Pre-Operative Nutrition Class:  Appt start time: 1730   End time:  1830.  Patient was seen on 03/05/2019 for Pre-Operative Bariatric Surgery Education at [facility].   Surgery date:  Surgery type: sleeve Start weight at [facility]: 217 Weight today: 227.3   The following the learning objectives were met by the patient during this course:  Identify Pre-Op Dietary Goals and will begin 2 weeks pre-operatively  Identify appropriate sources of fluids and proteins   State protein recommendations and appropriate sources pre and post-operatively  Identify Post-Operative Dietary Goals and will follow for 2 weeks post-operatively  Identify appropriate multivitamin and calcium sources  Describe the need for physical activity post-operatively and will follow MD recommendations  State when to call healthcare provider regarding medication questions or post-operative complications  Handouts given during class include:  Pre-Op Bariatric Surgery Diet Handout  Protein Shake Handout  Post-Op Bariatric Surgery Nutrition Handout  [facility] Program Information Flyer  Support Group Information Flyer  [facility] Bariatric Supplements Price List  Follow-Up Plan: Patient will follow-up at [facility] 2 weeks post operatively for diet advancement per MD.   

## 2019-03-07 ENCOUNTER — Ambulatory Visit (INDEPENDENT_AMBULATORY_CARE_PROVIDER_SITE_OTHER): Payer: BC Managed Care – PPO | Admitting: Psychology

## 2019-03-07 ENCOUNTER — Ambulatory Visit: Payer: BC Managed Care – PPO | Admitting: Dietician

## 2019-03-07 DIAGNOSIS — F509 Eating disorder, unspecified: Secondary | ICD-10-CM | POA: Diagnosis not present

## 2019-03-27 ENCOUNTER — Ambulatory Visit: Payer: Self-pay | Admitting: General Surgery

## 2019-04-02 NOTE — Patient Instructions (Addendum)
DUE TO COVID-19 ONLY ONE VISITOR IS ALLOWED TO COME WITH YOU AND STAY IN THE WAITING ROOM ONLY DURING PRE OP AND PROCEDURE DAY OF SURGERY. THE 1 VISITOR MAY VISIT WITH YOU AFTER SURGERY IN YOUR PRIVATE ROOM DURING VISITING HOURS ONLY!  YOU NEED TO HAVE A COVID 19 TEST ON 04-06-19 @ 2:45 PM, THIS TEST MUST BE DONE BEFORE SURGERY, COME  [address], [place] , [address].  [facility]) ONCE YOUR COVID TEST IS COMPLETED, PLEASE BEGIN THE QUARANTINE INSTRUCTIONS AS OUTLINED IN YOUR HANDOUT.                Audrey Coleman  04/02/2019   Your procedure is scheduled on: 04-10-19   Report to [facility] Main  Entrance    Report to Admitting at 7:35 AM     Call this number if you have problems the morning of surgery [phone]    Remember: MORNING OF SURGERY DRINK:   DRINK 1 G2 drink BEFORE YOU LEAVE HOME, DRINK ALL OF THE  G2 DRINK AT ONE TIME.  NO SOLID FOOD AFTER 600 PM THE NIGHT BEFORE YOUR SURGERY. YOU MAY DRINK CLEAR FLUIDS. THE G2 DRINK YOU DRINK BEFORE YOU LEAVE HOME WILL BE THE LAST FLUIDS YOU DRINK BEFORE SURGERY. PLEASE DRINK BY 6:35 AM       CLEAR LIQUID DIET   Foods Allowed                                                                     Foods Excluded  Coffee and tea, regular and decaf                             liquids that you cannot  Plain Jell-O any favor except red or purple                                           see through such as: Fruit ices (not with fruit pulp)                                     milk, soups, orange juice  Iced Popsicles                                    All solid food Carbonated beverages, regular and diet                                    Cranberry, grape and apple juices Sports drinks like Gatorade Lightly seasoned clear broth or consume(fat free) Sugar, honey syrup   _____________________________________________________________________    PAIN IS EXPECTED AFTER SURGERY AND WILL NOT BE COMPLETELY  ELIMINATED. AMBULATION AND TYLENOL WILL HELP REDUCE INCISIONAL AND GAS PAIN. MOVEMENT IS KEY!  YOU ARE EXPECTED TO BE OUT OF BED WITHIN 4 HOURS OF ADMISSION TO YOUR PATIENT ROOM.  SITTING  IN THE RECLINER THROUGHOUT THE DAY IS IMPORTANT FOR DRINKING FLUIDS AND MOVING GAS THROUGHOUT THE GI TRACT.  COMPRESSION STOCKINGS SHOULD BE WORN [facility] STAY UNLESS YOU ARE WALKING.   INCENTIVE SPIROMETER SHOULD BE USED EVERY HOUR WHILE AWAKE TO DECREASE POST-OPERATIVE COMPLICATIONS SUCH AS PNEUMONIA.  WHEN DISCHARGED HOME, IT IS IMPORTANT TO CONTINUE TO WALK EVERY HOUR AND USE THE INCENTIVE SPIROMETER EVERY HOUR.         Take these medicines the morning of surgery with A SIP OF WATER: None      BRUSH YOUR TEETH MORNING OF SURGERY AND RINSE YOUR MOUTH OUT, NO CHEWING GUM CANDY OR MINTS.                            You may not have any metal on your body including hair pins and              piercings     Do not wear jewelry, make-up, lotions, powders or perfumes, deodorant              Do not wear nail polish on your fingernails.  Do not shave  48 hours prior to surgery.           Do not bring valuables to the hospital. [facility] IS NOT             RESPONSIBLE   FOR VALUABLES.  Contacts, dentures or bridgework may not be worn into surgery.  Leave suitcase in the car. After surgery it may be brought to your room.     Patients discharged the day of surgery will not be allowed to drive home. IF YOU ARE HAVING SURGERY AND GOING HOME THE SAME DAY, YOU MUST HAVE AN ADULT TO DRIVE YOU HOME AND BE WITH YOU FOR 24 HOURS. YOU MAY GO HOME BY TAXI OR UBER OR ORTHERWISE, BUT AN ADULT MUST ACCOMPANY YOU HOME AND STAY WITH YOU FOR 24 HOURS.  Name and phone number of your driver:  Special Instructions: N/A              Please read over the following fact sheets you were given: _____________________________________________________________________  [facility] - Preparing for Surgery Before  surgery, you can play an important role.  Because skin is not sterile, your skin needs to be as free of germs as possible.  You can reduce the number of germs on your skin by washing with CHG (chlorahexidine gluconate) soap before surgery.  CHG is an antiseptic cleaner which kills germs and bonds with the skin to continue killing germs even after washing. Please DO NOT use if you have an allergy to CHG or antibacterial soaps.  If your skin becomes reddened/irritated stop using the CHG and inform your nurse when you arrive at Short Stay. Do not shave (including legs and underarms) for at least 48 hours prior to the first CHG shower.  You may shave your face/neck. Please follow these instructions carefully:  1.  Shower with CHG Soap the night before surgery and the  morning of Surgery.  2.  If you choose to wash your hair, wash your hair first as usual with your  normal  shampoo.  3.  After you shampoo, rinse your hair and body thoroughly to remove the  shampoo.                           4.  Use CHG as you would any other liquid soap.  You can apply chg directly  to the skin and wash                       Gently with a scrungie or clean washcloth.  5.  Apply the CHG Soap to your body ONLY FROM THE NECK DOWN.   Do not use on face/ open                           Wound or open sores. Avoid contact with eyes, ears mouth and genitals (private parts).                       Wash face,  Genitals (private parts) with your normal soap.             6.  Wash thoroughly, paying special attention to the area where your surgery  will be performed.  7.  Thoroughly rinse your body with warm water from the neck down.  8.  DO NOT shower/wash with your normal soap after using and rinsing off  the CHG Soap.                9.  Pat yourself dry with a clean towel.            10.  Wear clean pajamas.            11.  Place clean sheets on your bed the night of your first shower and do not  sleep with pets. Day of Surgery  : Do not apply any lotions/deodorants the morning of surgery.  Please wear clean clothes to the hospital/surgery center.  FAILURE TO FOLLOW THESE INSTRUCTIONS MAY RESULT IN THE CANCELLATION OF YOUR SURGERY PATIENT SIGNATURE_________________________________  NURSE SIGNATURE__________________________________  ________________________________________________________________________  WHAT IS A BLOOD TRANSFUSION? Blood Transfusion Information  A transfusion is the replacement of blood or some of its parts. Blood is made up of multiple cells which provide different functions.  Red blood cells carry oxygen and are used for blood loss replacement.  Posten blood cells fight against infection.  Platelets control bleeding.  Plasma helps clot blood.  Other blood products are available for specialized needs, such as hemophilia or other clotting disorders. BEFORE THE TRANSFUSION  Who gives blood for transfusions?   Healthy volunteers who are fully evaluated to make sure their blood is safe. This is blood bank blood. Transfusion therapy is the safest it has ever been in the practice of medicine. Before blood is taken from a donor, a complete history is taken to make sure that person has no history of diseases nor engages in risky social behavior (examples are intravenous drug use or sexual activity with multiple partners). The donor's travel history is screened to minimize risk of transmitting infections, such as malaria. The donated blood is tested for signs of infectious diseases, such as HIV and hepatitis. The blood is then tested to be sure it is compatible with you in order to minimize the chance of a transfusion reaction. If you or a relative donates blood, this is often done in anticipation of surgery and is not appropriate for emergency situations. It takes many days to process the donated blood. RISKS AND COMPLICATIONS Although transfusion therapy is very safe and saves many lives, the main  dangers of transfusion include:   Getting an infectious disease.  Developing a transfusion reaction. This is  an allergic reaction to something in the blood you were given. Every precaution is taken to prevent this. The decision to have a blood transfusion has been considered carefully by your caregiver before blood is given. Blood is not given unless the benefits outweigh the risks. AFTER THE TRANSFUSION  Right after receiving a blood transfusion, you will usually feel much better and more energetic. This is especially true if your red blood cells have gotten low (anemic). The transfusion raises the level of the red blood cells which carry oxygen, and this usually causes an energy increase.  The nurse administering the transfusion will monitor you carefully for complications. HOME CARE INSTRUCTIONS  No special instructions are needed after a transfusion. You may find your energy is better. Speak with your caregiver about any limitations on activity for underlying diseases you may have. SEEK MEDICAL CARE IF:   Your condition is not improving after your transfusion.  You develop redness or irritation at the intravenous (IV) site. SEEK IMMEDIATE MEDICAL CARE IF:  Any of the following symptoms occur over the next 12 hours:  Shaking chills.  You have a temperature by mouth above 102 F (38.9 C), not controlled by medicine.  Chest, back, or muscle pain.  People around you feel you are not acting correctly or are confused.  Shortness of breath or difficulty breathing.  Dizziness and fainting.  You get a rash or develop hives.  You have a decrease in urine output.  Your urine turns a dark color or changes to pink, red, or brown. Any of the following symptoms occur over the next 10 days:  You have a temperature by mouth above 102 F (38.9 C), not controlled by medicine.  Shortness of breath.  Weakness after normal activity.  The [place] part of the eye turns yellow  (jaundice).  You have a decrease in the amount of urine or are urinating less often.  Your urine turns a dark color or changes to pink, red, or brown. Document Released: 01/02/2000 Document Revised: 03/29/2011 Document Reviewed: 08/21/2007 [facility] Patient Information 2014 [place], [place].  _______________________________________________________________________

## 2019-04-02 NOTE — Progress Notes (Signed)
PCP - Karleen Hampshire Cardiologist -   Chest x-ray - 12-27-18  EKG - 12-27-18 Stress Test -  ECHO -  Cardiac Cath -   Sleep Study -  CPAP -   Fasting Blood Sugar -  Checks Blood Sugar _____ times a day  Blood Thinner Instructions: Aspirin Instructions: Last Dose:  Anesthesia review:   Patient denies shortness of breath, fever, cough and chest pain at PAT appointment   Patient verbalized understanding of instructions that were given to them at the PAT appointment. Patient was also instructed that they will need to review over the PAT instructions again at home before surgery.

## 2019-04-03 ENCOUNTER — Encounter (HOSPITAL_COMMUNITY): Payer: Self-pay

## 2019-04-03 ENCOUNTER — Encounter (HOSPITAL_COMMUNITY)
Admission: RE | Admit: 2019-04-03 | Discharge: 2019-04-03 | Disposition: A | Discharge status: Home / Self Care | Payer: BC Managed Care – PPO | Source: Ambulatory Visit | Attending: General Surgery | Admitting: General Surgery

## 2019-04-03 ENCOUNTER — Other Ambulatory Visit: Payer: Self-pay

## 2019-04-03 DIAGNOSIS — Z01812 Encounter for preprocedural laboratory examination: Secondary | ICD-10-CM | POA: Insufficient documentation

## 2019-04-03 DIAGNOSIS — Z20822 Contact with and (suspected) exposure to covid-19: Secondary | ICD-10-CM | POA: Insufficient documentation

## 2019-04-03 LAB — CBC WITH DIFFERENTIAL/PLATELET
Abs Immature Granulocytes: 0.04 10*3/uL (ref 0.00–0.07)
Basophils Absolute: 0.1 10*3/uL (ref 0.0–0.1)
Basophils Relative: 1 %
Eosinophils Absolute: 0.3 10*3/uL (ref 0.0–0.5)
Eosinophils Relative: 3 %
HCT: 41.4 % (ref 36.0–46.0)
Hemoglobin: 13.2 g/dL (ref 12.0–15.0)
Immature Granulocytes: 0 %
Lymphocytes Relative: 19 %
Lymphs Abs: 1.8 10*3/uL (ref 0.7–4.0)
MCH: 28.1 pg (ref 26.0–34.0)
MCHC: 31.9 g/dL (ref 30.0–36.0)
MCV: 88.1 fL (ref 80.0–100.0)
Monocytes Absolute: 0.6 10*3/uL (ref 0.1–1.0)
Monocytes Relative: 6 %
Neutro Abs: 6.9 10*3/uL (ref 1.7–7.7)
Neutrophils Relative %: 71 %
Platelets: 383 10*3/uL (ref 150–400)
RBC: 4.7 MIL/uL (ref 3.87–5.11)
RDW: 14.1 % (ref 11.5–15.5)
WBC: 9.6 10*3/uL (ref 4.0–10.5)
nRBC: 0 % (ref 0.0–0.2)

## 2019-04-03 LAB — COMPREHENSIVE METABOLIC PANEL
ALT: 10 U/L (ref 0–44)
AST: 17 U/L (ref 15–41)
Albumin: 3.7 g/dL (ref 3.5–5.0)
Alkaline Phosphatase: 44 U/L (ref 38–126)
Anion gap: 7 (ref 5–15)
BUN: 8 mg/dL (ref 6–20)
CO2: 25 mmol/L (ref 22–32)
Calcium: 8.7 mg/dL — ABNORMAL LOW (ref 8.9–10.3)
Chloride: 105 mmol/L (ref 98–111)
Creatinine, Ser: 0.85 mg/dL (ref 0.44–1.00)
GFR calc Af Amer: >60 mL/min (ref >60)
GFR calc non Af Amer: >60 mL/min (ref >60)
Glucose, Bld: 88 mg/dL (ref 70–99)
Potassium: 3.7 mmol/L (ref 3.5–5.1)
Sodium: 137 mmol/L (ref 135–145)
Total Bilirubin: 0.5 mg/dL (ref 0.3–1.2)
Total Protein: 6.8 g/dL (ref 6.5–8.1)

## 2019-04-03 LAB — ABO/RH: ABO/RH(D): O POS

## 2019-04-04 ENCOUNTER — Ambulatory Visit: Payer: BC Managed Care – PPO | Admitting: Psychology

## 2019-04-06 ENCOUNTER — Other Ambulatory Visit (HOSPITAL_COMMUNITY)
Admission: RE | Admit: 2019-04-06 | Discharge: 2019-04-06 | Disposition: A | Discharge status: Home / Self Care | Payer: BC Managed Care – PPO | Source: Ambulatory Visit | Attending: General Surgery | Admitting: General Surgery

## 2019-04-06 DIAGNOSIS — Z20822 Contact with and (suspected) exposure to covid-19: Secondary | ICD-10-CM | POA: Diagnosis not present

## 2019-04-06 DIAGNOSIS — Z01812 Encounter for preprocedural laboratory examination: Secondary | ICD-10-CM | POA: Insufficient documentation

## 2019-04-06 LAB — SARS CORONAVIRUS 2 (TAT 6-24 HRS): SARS Coronavirus 2: NEGATIVE

## 2019-04-09 MED ORDER — BUPIVACAINE LIPOSOME 1.3 % IJ SUSP
20.0000 mL | INTRAMUSCULAR | Status: DC
  Filled 2019-04-09: qty 20

## 2019-04-10 ENCOUNTER — Inpatient Hospital Stay (HOSPITAL_COMMUNITY): Payer: BC Managed Care – PPO | Admitting: Certified Registered Nurse Anesthetist

## 2019-04-10 ENCOUNTER — Encounter (HOSPITAL_COMMUNITY): Payer: Self-pay | Admitting: General Surgery

## 2019-04-10 ENCOUNTER — Other Ambulatory Visit: Payer: Self-pay

## 2019-04-10 ENCOUNTER — Encounter (HOSPITAL_COMMUNITY)
Admission: RE | Disposition: A | Discharge status: Home / Self Care | Payer: Self-pay | Source: Home / Self Care | Attending: General Surgery

## 2019-04-10 ENCOUNTER — Inpatient Hospital Stay (HOSPITAL_COMMUNITY)
Admission: RE | Admit: 2019-04-10 | Discharge: 2019-04-11 | DRG: 621 | Disposition: A | Discharge status: Home / Self Care | Payer: BC Managed Care – PPO | Attending: General Surgery | Admitting: General Surgery

## 2019-04-10 DIAGNOSIS — Z825 Family history of asthma and other chronic lower respiratory diseases: Secondary | ICD-10-CM | POA: Diagnosis not present

## 2019-04-10 DIAGNOSIS — Z6841 Body Mass Index (BMI) 40.0 and over, adult: Secondary | ICD-10-CM | POA: Diagnosis not present

## 2019-04-10 DIAGNOSIS — K219 Gastro-esophageal reflux disease without esophagitis: Secondary | ICD-10-CM | POA: Diagnosis not present

## 2019-04-10 DIAGNOSIS — Z8249 Family history of ischemic heart disease and other diseases of the circulatory system: Secondary | ICD-10-CM | POA: Diagnosis not present

## 2019-04-10 DIAGNOSIS — I10 Essential (primary) hypertension: Secondary | ICD-10-CM | POA: Diagnosis present

## 2019-04-10 DIAGNOSIS — Z8041 Family history of malignant neoplasm of ovary: Secondary | ICD-10-CM

## 2019-04-10 DIAGNOSIS — E669 Obesity, unspecified: Secondary | ICD-10-CM | POA: Diagnosis present

## 2019-04-10 HISTORY — PX: LAPAROSCOPIC GASTRIC SLEEVE RESECTION: SHX5895

## 2019-04-10 LAB — TYPE AND SCREEN
ABO/RH(D): O POS
Antibody Screen: NEGATIVE

## 2019-04-10 LAB — HEMOGLOBIN AND HEMATOCRIT, BLOOD
HCT: 40.3 % (ref 36.0–46.0)
Hemoglobin: 12.8 g/dL (ref 12.0–15.0)

## 2019-04-10 LAB — PREGNANCY, URINE: Preg Test, Ur: NEGATIVE

## 2019-04-10 SURGERY — GASTRECTOMY, SLEEVE, LAPAROSCOPIC
Anesthesia: General | Site: Abdomen

## 2019-04-10 MED ORDER — FENTANYL CITRATE (PF) 100 MCG/2ML IJ SOLN: 25.0000 ug | INTRAMUSCULAR | Status: DC | PRN

## 2019-04-10 MED ORDER — DEXAMETHASONE SODIUM PHOSPHATE 10 MG/ML IJ SOLN
INTRAMUSCULAR | Status: AC
  Filled 2019-04-10: qty 1

## 2019-04-10 MED ORDER — DEXAMETHASONE SODIUM PHOSPHATE 10 MG/ML IJ SOLN
INTRAMUSCULAR | Status: DC | PRN
  Administered 2019-04-10: 5 mg via INTRAVENOUS

## 2019-04-10 MED ORDER — PROPOFOL 10 MG/ML IV BOLUS
INTRAVENOUS | Status: DC | PRN
  Administered 2019-04-10: 200 mg via INTRAVENOUS

## 2019-04-10 MED ORDER — ONDANSETRON HCL 4 MG/2ML IJ SOLN
INTRAMUSCULAR | Status: AC
  Filled 2019-04-10: qty 2

## 2019-04-10 MED ORDER — PHENYLEPHRINE HCL-NACL 10-0.9 MG/250ML-% IV SOLN
INTRAVENOUS | Status: DC | PRN
  Administered 2019-04-10: 35 ug/min via INTRAVENOUS

## 2019-04-10 MED ORDER — ONDANSETRON HCL 4 MG/2ML IJ SOLN: 4.0000 mg | Freq: Once | INTRAMUSCULAR | Status: DC | PRN

## 2019-04-10 MED ORDER — ONDANSETRON HCL 4 MG/2ML IJ SOLN: 4.0000 mg | INTRAMUSCULAR | Status: DC | PRN

## 2019-04-10 MED ORDER — BUPIVACAINE LIPOSOME 1.3 % IJ SUSP
INTRAMUSCULAR | Status: DC | PRN
  Administered 2019-04-10: 20 mL

## 2019-04-10 MED ORDER — LACTATED RINGERS IV SOLN: INTRAVENOUS | Status: DC

## 2019-04-10 MED ORDER — HEPARIN SODIUM (PORCINE) 5000 UNIT/ML IJ SOLN
5000.0000 [IU] | INTRAMUSCULAR | Status: AC
  Administered 2019-04-10: 08:00:00 5000 [IU] via SUBCUTANEOUS
  Filled 2019-04-10: qty 1

## 2019-04-10 MED ORDER — SODIUM CHLORIDE 0.9 % IV SOLN
2.0000 g | INTRAVENOUS | Status: AC
  Administered 2019-04-10: 2 g via INTRAVENOUS
  Filled 2019-04-10: qty 2

## 2019-04-10 MED ORDER — SUGAMMADEX SODIUM 200 MG/2ML IV SOLN
INTRAVENOUS | Status: DC | PRN
  Administered 2019-04-10: 300 mg via INTRAVENOUS

## 2019-04-10 MED ORDER — INFLUENZA VAC SPLIT QUAD 0.5 ML IM SUSY: 0.5000 mL | PREFILLED_SYRINGE | INTRAMUSCULAR | Status: DC

## 2019-04-10 MED ORDER — KETAMINE HCL 10 MG/ML IJ SOLN
INTRAMUSCULAR | Status: DC | PRN
  Administered 2019-04-10: 30 mg via INTRAVENOUS

## 2019-04-10 MED ORDER — MIDAZOLAM HCL 2 MG/2ML IJ SOLN
INTRAMUSCULAR | Status: AC
  Filled 2019-04-10: qty 2

## 2019-04-10 MED ORDER — ENOXAPARIN SODIUM 30 MG/0.3ML ~~LOC~~ SOLN
30.0000 mg | Freq: Two times a day (BID) | SUBCUTANEOUS | Status: DC
  Administered 2019-04-10 – 2019-04-11 (×2): 30 mg via SUBCUTANEOUS
  Filled 2019-04-10 (×2): qty 0.3

## 2019-04-10 MED ORDER — ACETAMINOPHEN 500 MG PO TABS: 1000.0000 mg | ORAL_TABLET | Freq: Three times a day (TID) | ORAL | Status: DC

## 2019-04-10 MED ORDER — APREPITANT 40 MG PO CAPS
40.0000 mg | ORAL_CAPSULE | ORAL | Status: AC
  Administered 2019-04-10: 40 mg via ORAL
  Filled 2019-04-10: qty 1

## 2019-04-10 MED ORDER — PHENYLEPHRINE HCL (PRESSORS) 10 MG/ML IV SOLN
INTRAVENOUS | Status: AC
  Filled 2019-04-10: qty 1

## 2019-04-10 MED ORDER — DEXTROSE-NACL 5-0.45 % IV SOLN: INTRAVENOUS | Status: DC

## 2019-04-10 MED ORDER — ACETAMINOPHEN 160 MG/5ML PO SOLN
1000.0000 mg | Freq: Three times a day (TID) | ORAL | Status: DC
  Administered 2019-04-10 – 2019-04-11 (×2): 1000 mg via ORAL
  Filled 2019-04-10 (×2): qty 40.6

## 2019-04-10 MED ORDER — FENTANYL CITRATE (PF) 250 MCG/5ML IJ SOLN
INTRAMUSCULAR | Status: AC
  Filled 2019-04-10: qty 5

## 2019-04-10 MED ORDER — GABAPENTIN 100 MG PO CAPS
200.0000 mg | ORAL_CAPSULE | Freq: Two times a day (BID) | ORAL | Status: DC
  Administered 2019-04-10 – 2019-04-11 (×3): 200 mg via ORAL
  Filled 2019-04-10 (×3): qty 2

## 2019-04-10 MED ORDER — PHENYLEPHRINE 40 MCG/ML (10ML) SYRINGE FOR IV PUSH (FOR BLOOD PRESSURE SUPPORT)
PREFILLED_SYRINGE | INTRAVENOUS | Status: DC | PRN
  Administered 2019-04-10: 80 ug via INTRAVENOUS

## 2019-04-10 MED ORDER — FENTANYL CITRATE (PF) 250 MCG/5ML IJ SOLN
INTRAMUSCULAR | Status: DC | PRN
  Administered 2019-04-10: 150 ug via INTRAVENOUS
  Administered 2019-04-10: 50 ug via INTRAVENOUS

## 2019-04-10 MED ORDER — SCOPOLAMINE 1 MG/3DAYS TD PT72
1.0000 | MEDICATED_PATCH | TRANSDERMAL | Status: DC
  Administered 2019-04-10: 08:00:00 1.5 mg via TRANSDERMAL
  Filled 2019-04-10: qty 1

## 2019-04-10 MED ORDER — ROCURONIUM BROMIDE 50 MG/5ML IV SOSY
PREFILLED_SYRINGE | INTRAVENOUS | Status: DC | PRN
  Administered 2019-04-10: 70 mg via INTRAVENOUS

## 2019-04-10 MED ORDER — PROPOFOL 10 MG/ML IV BOLUS
INTRAVENOUS | Status: AC
  Filled 2019-04-10: qty 20

## 2019-04-10 MED ORDER — ROCURONIUM BROMIDE 10 MG/ML (PF) SYRINGE
PREFILLED_SYRINGE | INTRAVENOUS | Status: AC
  Filled 2019-04-10: qty 10

## 2019-04-10 MED ORDER — CHLORHEXIDINE GLUCONATE 4 % EX LIQD: 60.0000 mL | Freq: Once | CUTANEOUS | Status: DC

## 2019-04-10 MED ORDER — DEXAMETHASONE SODIUM PHOSPHATE 4 MG/ML IJ SOLN: 4.0000 mg | INTRAMUSCULAR | Status: DC

## 2019-04-10 MED ORDER — ENSURE MAX PROTEIN PO LIQD
2.0000 [oz_av] | ORAL | Status: DC
  Administered 2019-04-11 (×3): 2 [oz_av] via ORAL

## 2019-04-10 MED ORDER — FAMOTIDINE IN NACL 20-0.9 MG/50ML-% IV SOLN
20.0000 mg | Freq: Two times a day (BID) | INTRAVENOUS | Status: DC
  Administered 2019-04-10 – 2019-04-11 (×3): 20 mg via INTRAVENOUS
  Filled 2019-04-10 (×3): qty 50

## 2019-04-10 MED ORDER — MORPHINE SULFATE (PF) 2 MG/ML IV SOLN
1.0000 mg | INTRAVENOUS | Status: DC | PRN
  Administered 2019-04-10 (×2): 2 mg via INTRAVENOUS
  Filled 2019-04-10 (×2): qty 1

## 2019-04-10 MED ORDER — 0.9 % SODIUM CHLORIDE (POUR BTL) OPTIME
TOPICAL | Status: DC | PRN
  Administered 2019-04-10: 1000 mL

## 2019-04-10 MED ORDER — GABAPENTIN 300 MG PO CAPS
300.0000 mg | ORAL_CAPSULE | ORAL | Status: AC
  Administered 2019-04-10: 08:00:00 300 mg via ORAL
  Filled 2019-04-10: qty 1

## 2019-04-10 MED ORDER — OXYCODONE HCL 5 MG/5ML PO SOLN
5.0000 mg | Freq: Four times a day (QID) | ORAL | Status: DC | PRN
  Administered 2019-04-10 – 2019-04-11 (×4): 5 mg via ORAL
  Filled 2019-04-10 (×5): qty 5

## 2019-04-10 MED ORDER — ONDANSETRON HCL 4 MG/2ML IJ SOLN
INTRAMUSCULAR | Status: DC | PRN
  Administered 2019-04-10: 4 mg via INTRAVENOUS

## 2019-04-10 MED ORDER — LACTATED RINGERS IR SOLN
Status: DC | PRN
  Administered 2019-04-10: 1000 mL

## 2019-04-10 MED ORDER — LIDOCAINE 2% (20 MG/ML) 5 ML SYRINGE
INTRAMUSCULAR | Status: AC
  Filled 2019-04-10: qty 5

## 2019-04-10 MED ORDER — SUGAMMADEX SODIUM 500 MG/5ML IV SOLN
INTRAVENOUS | Status: AC
  Filled 2019-04-10: qty 5

## 2019-04-10 MED ORDER — SIMETHICONE 80 MG PO CHEW: 80.0000 mg | CHEWABLE_TABLET | Freq: Four times a day (QID) | ORAL | Status: DC | PRN

## 2019-04-10 MED ORDER — LIDOCAINE 2% (20 MG/ML) 5 ML SYRINGE
INTRAMUSCULAR | Status: DC | PRN
  Administered 2019-04-10: 1.5 mg/kg/h via INTRAVENOUS

## 2019-04-10 MED ORDER — STERILE WATER FOR IRRIGATION IR SOLN
Status: DC | PRN
  Administered 2019-04-10: 1000 mL

## 2019-04-10 MED ORDER — ACETAMINOPHEN 500 MG PO TABS
1000.0000 mg | ORAL_TABLET | ORAL | Status: AC
  Administered 2019-04-10: 1000 mg via ORAL
  Filled 2019-04-10: qty 2

## 2019-04-10 MED ORDER — LIDOCAINE 2% (20 MG/ML) 5 ML SYRINGE
INTRAMUSCULAR | Status: DC | PRN
  Administered 2019-04-10: 100 mg via INTRAVENOUS

## 2019-04-10 MED ORDER — BUPIVACAINE HCL 0.25 % IJ SOLN
INTRAMUSCULAR | Status: DC | PRN
  Administered 2019-04-10: 30 mL

## 2019-04-10 MED ORDER — BUPIVACAINE HCL 0.25 % IJ SOLN
INTRAMUSCULAR | Status: AC
  Filled 2019-04-10: qty 1

## 2019-04-10 MED ORDER — MIDAZOLAM HCL 5 MG/5ML IJ SOLN
INTRAMUSCULAR | Status: DC | PRN
  Administered 2019-04-10: 2 mg via INTRAVENOUS

## 2019-04-10 SURGICAL SUPPLY — 64 items
APL PRP STRL LF DISP 70% ISPRP (MISCELLANEOUS) ×1
APL SKNCLS STERI-STRIP NONHPOA (GAUZE/BANDAGES/DRESSINGS) ×1
APPLIER CLIP ROT 13.4 12 LRG (CLIP)
APR CLP LRG 13.4X12 ROT 20 MLT (CLIP)
BAG LAPAROSCOPIC 12 15 PORT 16 (BASKET) ×1 IMPLANT
BAG RETRIEVAL 12/15 (BASKET) ×2
BAG RETRIEVAL 12/15MM (BASKET) ×1
BENZOIN TINCTURE PRP APPL 2/3 (GAUZE/BANDAGES/DRESSINGS) ×3 IMPLANT
BLADE SURG SZ11 CARB STEEL (BLADE) ×3 IMPLANT
BNDG ADH 1X3 SHEER STRL LF (GAUZE/BANDAGES/DRESSINGS) ×8 IMPLANT
BNDG ADH THN 3X1 STRL LF (GAUZE/BANDAGES/DRESSINGS) ×1
CABLE HIGH FREQUENCY MONO STRZ (ELECTRODE) IMPLANT
CHLORAPREP W/TINT 26 (MISCELLANEOUS) ×3 IMPLANT
CLIP APPLIE ROT 13.4 12 LRG (CLIP) IMPLANT
CLOSURE WOUND 1/2 X4 (GAUZE/BANDAGES/DRESSINGS) ×1
COVER SURGICAL LIGHT HANDLE (MISCELLANEOUS) ×3 IMPLANT
COVER WAND RF STERILE (DRAPES) IMPLANT
DRAPE UTILITY XL STRL (DRAPES) ×6 IMPLANT
ELECT REM PT RETURN 15FT ADLT (MISCELLANEOUS) ×3 IMPLANT
GAUZE 4X4 16PLY RFD (DISPOSABLE) ×3 IMPLANT
GLOVE BIOGEL PI IND STRL 7.0 (GLOVE) ×1 IMPLANT
GLOVE BIOGEL PI INDICATOR 7.0 (GLOVE) ×2
GLOVE SURG SS PI 7.0 STRL IVOR (GLOVE) ×3 IMPLANT
GOWN STRL REUS W/TWL LRG LVL3 (GOWN DISPOSABLE) ×3 IMPLANT
GOWN STRL REUS W/TWL XL LVL3 (GOWN DISPOSABLE) ×9 IMPLANT
GRASPER SUT TROCAR 14GX15 (MISCELLANEOUS) ×3 IMPLANT
HOVERMATT SINGLE USE (MISCELLANEOUS) IMPLANT
KIT BASIN OR (CUSTOM PROCEDURE TRAY) ×3 IMPLANT
KIT TURNOVER KIT A (KITS) IMPLANT
MARKER SKIN DUAL TIP RULER LAB (MISCELLANEOUS) ×3 IMPLANT
NDL SPNL 22GX3.5 QUINCKE BK (NEEDLE) ×1 IMPLANT
NEEDLE SPNL 22GX3.5 QUINCKE BK (NEEDLE) ×3 IMPLANT
PACK UNIVERSAL I (CUSTOM PROCEDURE TRAY) ×3 IMPLANT
PENCIL SMOKE EVACUATOR (MISCELLANEOUS) IMPLANT
RELOAD STAPLE 60 3.6 BLU REG (STAPLE) IMPLANT
RELOAD STAPLE 60 3.8 GOLD REG (STAPLE) IMPLANT
RELOAD STAPLE 60 4.1 GRN THCK (STAPLE) IMPLANT
RELOAD STAPLER BLUE 60MM (STAPLE) ×3 IMPLANT
RELOAD STAPLER GOLD 60MM (STAPLE) ×1 IMPLANT
RELOAD STAPLER GREEN 60MM (STAPLE) ×1 IMPLANT
SCISSORS LAP 5X45 EPIX DISP (ENDOMECHANICALS) IMPLANT
SET IRRIG TUBING LAPAROSCOPIC (IRRIGATION / IRRIGATOR) ×3 IMPLANT
SET TUBE SMOKE EVAC HIGH FLOW (TUBING) ×3 IMPLANT
SHEARS HARMONIC ACE PLUS 45CM (MISCELLANEOUS) ×3 IMPLANT
SLEEVE GASTRECTOMY 40FR VISIGI (MISCELLANEOUS) ×3 IMPLANT
SLEEVE XCEL OPT CAN 5 100 (ENDOMECHANICALS) ×6 IMPLANT
SOL ANTI FOG 6CC (MISCELLANEOUS) ×1 IMPLANT
SOLUTION ANTI FOG 6CC (MISCELLANEOUS) ×2
STAPLER ECHELON LONG 60 440 (INSTRUMENTS) ×3 IMPLANT
STAPLER RELOAD BLUE 60MM (STAPLE) ×9
STAPLER RELOAD GOLD 60MM (STAPLE) ×3
STAPLER RELOAD GREEN 60MM (STAPLE) ×3
STRIP CLOSURE SKIN 1/2X4 (GAUZE/BANDAGES/DRESSINGS) ×2 IMPLANT
SUT ETHIBOND 0 36 GRN (SUTURE) IMPLANT
SUT MNCRL AB 4-0 PS2 18 (SUTURE) ×3 IMPLANT
SUT VICRYL 0 TIES 12 18 (SUTURE) ×3 IMPLANT
SYR 20ML LL LF (SYRINGE) ×3 IMPLANT
SYR 50ML LL SCALE MARK (SYRINGE) ×3 IMPLANT
TOWEL OR 17X26 10 PK STRL BLUE (TOWEL DISPOSABLE) ×3 IMPLANT
TOWEL OR NON WOVEN STRL DISP B (DISPOSABLE) ×3 IMPLANT
TROCAR BLADELESS 15MM (ENDOMECHANICALS) ×3 IMPLANT
TROCAR BLADELESS OPT 5 100 (ENDOMECHANICALS) ×3 IMPLANT
TUBING CONNECTING 10 (TUBING) ×2 IMPLANT
TUBING CONNECTING 10' (TUBING) ×1

## 2019-04-10 NOTE — H&P (Signed)
Audrey Coleman is an 44 y.o. female.   Chief Complaint: obesity HPI: 44 yo female with long history of obesity. She has completed all requirements and is ready to proceed with bariatric surgery.  Past Medical History:  Diagnosis Date  . Abnormal uterine bleeding (AUB) 08/26/2015  . Hypertension     Past Surgical History:  Procedure Laterality Date  . NO PAST SURGERIES      Family History  Problem Relation Age of Onset  . Hypertension Mother   . Hypertension Father   . COPD Maternal Grandmother   . Cancer Paternal Aunt        Ovarian   Social History:  reports that she has never smoked. She has never used smokeless tobacco. She reports that she does not drink alcohol or use drugs.  Allergies: No Known Allergies  Medications Prior to Admission  Medication Sig Dispense Refill  . fluconazole (DIFLUCAN) 150 MG tablet Take 1 now and 1 in 3 days (Patient not taking: Reported on 03/28/2019) 2 tablet 1  . metroNIDAZOLE (FLAGYL) 500 MG tablet Take 1 tablet (500 mg total) by mouth 2 (two) times daily. (Patient not taking: Reported on 03/28/2019) 14 tablet 0    Results for orders placed or performed during the hospital encounter of 04/10/19 (from the past 48 hour(s))  Pregnancy, urine     Status: None   Collection Time: 04/10/19  7:36 AM  Result Value Ref Range   Preg Test, Ur NEGATIVE NEGATIVE    Comment:        THE SENSITIVITY OF THIS METHODOLOGY IS >20 mIU/mL. Performed at [facility], [address] [place], [place] [address]    No results found.  Review of Systems  Constitutional: Negative for chills and fever.  HENT: Negative for hearing loss.   Respiratory: Negative for cough.   Cardiovascular: Negative for chest pain and palpitations.  Gastrointestinal: Negative for abdominal pain, nausea and vomiting.  Genitourinary: Negative for dysuria and urgency.  Musculoskeletal: Negative for myalgias and neck pain.  Skin: Negative for rash.  Neurological:  Negative for dizziness and headaches.  Hematological: Does not bruise/bleed easily.  Psychiatric/Behavioral: Negative for suicidal ideas.    Blood pressure (!) 145/92, pulse 94, temperature 98.4 F (36.9 C), temperature source Oral, resp. rate 18, height 5\' 1"  (1.549 m), weight 103.4 kg, last menstrual period 03/24/2019, SpO2 100 %. Physical Exam  Vitals reviewed. Constitutional: She is oriented to person, place, and time. She appears well-developed and well-nourished.  HENT:  Head: Normocephalic and atraumatic.  Eyes: Pupils are equal, round, and reactive to light. Conjunctivae and EOM are normal.  Cardiovascular: Normal rate and regular rhythm.  Respiratory: Effort normal and breath sounds normal.  GI: Soft. Bowel sounds are normal. She exhibits no distension. There is no abdominal tenderness.  Musculoskeletal:        General: Normal range of motion.     Cervical back: Normal range of motion and neck supple.  Neurological: She is alert and oriented to person, place, and time.  Skin: Skin is warm and dry.  Psychiatric: She has a normal mood and affect. Her behavior is normal.     Assessment/Plan 44 yo female with class III obesity and hypertension -lap sleeve gastrectomy -ERAS and bariatric protocols -inpatient admission  56, MD 04/10/2019, 9:25 AM

## 2019-04-10 NOTE — Transfer of Care (Signed)
Immediate Anesthesia Transfer of Care Note  Patient: Audrey Coleman  Procedure(s) Performed: LAPAROSCOPIC GASTRIC SLEEVE RESECTION, Upper Endo, ERAS Pathway (N/A Abdomen)  Patient Location: PACU  Anesthesia Type:General  Level of Consciousness: awake, alert  and oriented  Airway & Oxygen Therapy: Patient Spontanous Breathing and Patient connected to face mask oxygen  Post-op Assessment: Report given to RN and Post -op Vital signs reviewed and stable  Post vital signs: Reviewed and stable  Last Vitals:  Vitals Value Taken Time  BP 158/98 04/10/19 1117  Temp    Pulse    Resp 14 04/10/19 1118  SpO2    Vitals shown include unvalidated device data.  Last Pain:  Vitals:   04/10/19 0806  TempSrc:   PainSc: 0-No pain         Complications: No apparent anesthesia complications

## 2019-04-10 NOTE — Progress Notes (Signed)
Pt started on water. 

## 2019-04-10 NOTE — Op Note (Signed)
Preop Diagnosis: Obesity Class III  Postop Diagnosis: same  Procedure performed: laparoscopic Sleeve Gastrectomy  Assitant: Romana Juniper  Indications:  The patient is a 44 y.o. year-old morbidly obese female who has been followed in the Bariatric Clinic as an outpatient. This patient was diagnosed with morbid obesity with a BMI of Body mass index is 43.08 kg/m. and significant co-morbidities including hypertension.  The patient was counseled extensively in the Bariatric Outpatient Clinic and after a thorough explanation of the risks and benefits of surgery (including death from complications, bowel leak, infection such as peritonitis and/or sepsis, internal hernia, bleeding, need for blood transfusion, bowel obstruction, organ failure, pulmonary embolus, deep venous thrombosis, wound infection, incisional hernia, skin breakdown, and others entailed on the consent form) and after a compliant diet and exercise program, the patient was scheduled for an elective laparoscopic sleeve gastrectomy.  Description of Operation:  Following informed consent, the patient was taken to the operating room and placed on the operating table in the supine position.  She had previously received prophylactic antibiotics and subcutaneous heparin for DVT prophylaxis in the pre-op holding area.  After induction of general endotracheal anesthesia by the anesthesiologist, the patient underwent placement of sequential compression devices and an oro-gastric tube.  A timeout was confirmed by the surgery and anesthesia teams.  The patient was adequately padded at all pressure points and placed on a footboard to prevent slippage from the OR table during extremes of position during surgery.  She underwent a routine sterile prep and drape of her entire abdomen.    Next, A transverse incision was made under the left subcostal area and a 32mm optical viewing trocar was introduced into the peritoneal cavity. Pneumoperitoneum was applied  with a high flow and low pressure. A laparoscope was inserted to confirm placement. A extraperitoneal block was then placed at the lateral abdominal wall using exparel diluted with marcaine. 5 additional incisions were placed: 1 67mm trocar to the left of the midline. 1 additional 70mm trocar in the left lateral area, 1 33mm trocar in the right mid abdomen, 1 26mm trocar in the right subcostal area, and a Nathanson retractor was placed through a subxiphoid incision.   The fat pad at the GE junction was incised and the gastrodiaphragmatic ligament was divided using the Harmonic scalpel. Next, a hole was created through the lesser omentum along the greater curve of the stomach to enter the lesser sac. The vessels along the greater omentum were  Then ligated and divided using the Harmonic scalpel moving towards the spleen and then short gastric vessels were ligated and divided in the same fashion to fully mobilize the fundus. The left crus was identified to ensure completion of the dissection. Next the antrum was measured and dissection continued inferiorly along the greater curve towards the pylorus and stopped 6cm from the pylorus.   A 40Fr ViSiGi dilator was placed into the esophgaus and along the lesser curve of the stomach and placed on suction. 1 non-reinforced 80mm Green load echelon stapler(s) followed by 1 1mm Gold load echelon stapler(s) followed by 3 59mm blue load echelon stapler(s) were used to make the resection along the antrum being sure to stay well away from the angularis by angling the jaws of the stapler towards the greater curve and later completing the resection staying along the [place] and ensuring the fundus was not retained by appropriately retracting it lateral. Air was inserted through the [place] to perform a leak test showing no bubbles and a neutral  lie of the stomach.  The assistant then went and performed an upper endoscopy and leak test. No bubbles were seen and the sleeve and  antrum distended appropriately. There was bleeding on the staple line and 3 hemoclips were placed with good hemostasis. The specimen was then placed in an endocatch bag and removed by the 42mm port. The fascia of the 36mm port was closed with a 0 vicryl by suture passer. Hemostasis was ensured. Pneumoperitoneum was evacuated, all ports were removed and all incisions closed with 4-0 monocryl suture in subcuticular fashion. Steristrips and bandaids were put in place for dressing. The patient awoke from anesthesia and was brought to pacu in stable condition. All counts were correct.  Estimated blood loss: <34ml  Specimens:  Sleeve gastrectomy  Local Anesthesia: 50 ml Exparel:0.5% Marcaine mix  Post-Op Plan:       Pain Management: PO, prn      Antibiotics: Prophylactic      Anticoagulation: Prophylactic, Starting now      Post Op Studies/Consults: Not applicable      Intended Discharge: within 48h      Intended Outpatient Follow-Up: Two Week      Intended Outpatient Studies: Not Applicable      Other: Not Applicable  Images:     De Blanch Gaye Scorza

## 2019-04-10 NOTE — Anesthesia Preprocedure Evaluation (Signed)
Anesthesia Evaluation  Patient identified by MRN, date of birth, ID band Patient awake    Reviewed: Allergy & Precautions, NPO status , Patient's Chart, lab work & pertinent test results  Airway Mallampati: II  TM Distance: >3 FB Neck ROM: Full    Dental no notable dental hx.    Pulmonary neg pulmonary ROS,    Pulmonary exam normal breath sounds clear to auscultation       Cardiovascular hypertension, Normal cardiovascular exam Rhythm:Regular Rate:Normal     Neuro/Psych negative neurological ROS  negative psych ROS   GI/Hepatic negative GI ROS, Neg liver ROS,   Endo/Other  Morbid obesity  Renal/GU negative Renal ROS  negative genitourinary   Musculoskeletal negative musculoskeletal ROS (+)   Abdominal   Peds negative pediatric ROS (+)  Hematology negative hematology ROS (+)   Anesthesia Other Findings   Reproductive/Obstetrics negative OB ROS                             Anesthesia Physical Anesthesia Plan  ASA: III  Anesthesia Plan: General   Post-op Pain Management:    Induction: Intravenous  PONV Risk Score and Plan: 3 and Ondansetron, Dexamethasone, Scopolamine patch - Pre-op, Midazolam and Treatment may vary due to age or medical condition  Airway Management Planned: Oral ETT  Additional Equipment:   Intra-op Plan:   Post-operative Plan: Extubation in OR  Informed Consent: I have reviewed the patients History and Physical, chart, labs and discussed the procedure including the risks, benefits and alternatives for the proposed anesthesia with the patient or authorized representative who has indicated his/her understanding and acceptance.     Dental advisory given  Plan Discussed with: CRNA and Surgeon  Anesthesia Plan Comments:         Anesthesia Quick Evaluation

## 2019-04-10 NOTE — Progress Notes (Signed)
PHARMACY CONSULT FOR:  Risk Assessment for Post-Discharge VTE Following Bariatric Surgery  Post-Discharge VTE Risk Assessment: This patient's probability of 30-day post-discharge VTE is increased due to the factors marked:   Female    Age >/=60 years    BMI >/=50 kg/m2    CHF    Dyspnea at Rest    Paraplegia   x Non-gastric-band surgery    Operation Time >/=3 hr    Return to OR     Length of Stay >/= 3 d      Hx of VTE   Hypercoagulable condition   Significant venous stasis   Predicted probability of 30-day post-discharge VTE: 0.16%  Other patient-specific factors to consider:  Recommendation for Discharge: No pharmacologic prophylaxis post-discharge  Audrey Coleman is a 44 y.o. female who underwent  laparoscopic sleeve gastrectomy on 04/10/19    Case start: 1012 Case end: 1106   No Known Allergies  Patient Measurements: Height: 5\' 1"  (154.9 cm) Weight: 228 lb (103.4 kg) IBW/kg (Calculated) : 47.8 Body mass index is 43.08 kg/m.  Recent Labs    04/10/19 1133  HGB 12.8  HCT 40.3   Estimated Creatinine Clearance: 94.3 mL/min (by C-G formula based on SCr of 0.85 mg/dL).    Past Medical History:  Diagnosis Date  . Abnormal uterine bleeding (AUB) 08/26/2015  . Hypertension      Medications Prior to Admission  Medication Sig Dispense Refill Last Dose  . fluconazole (DIFLUCAN) 150 MG tablet Take 1 now and 1 in 3 days (Patient not taking: Reported on 03/28/2019) 2 tablet 1 Not Taking at Unknown time  . metroNIDAZOLE (FLAGYL) 500 MG tablet Take 1 tablet (500 mg total) by mouth 2 (two) times daily. (Patient not taking: Reported on 03/28/2019) 14 tablet 0 Not Taking at Unknown time       05/28/2019 04/10/2019,1:32 PM

## 2019-04-10 NOTE — Discharge Instructions (Signed)
° ° ° °GASTRIC BYPASS/SLEEVE ° Home Care Instructions ° ° These instructions are to help you care for yourself when you go home. ° °Call: If you have any problems. °• Call [phone] and ask for the surgeon on call °• If you need immediate help, come to the ER at [facility].  °• Tell the ER staff that you are a new post-op gastric bypass or gastric sleeve patient °  °Signs and symptoms to report: • Severe vomiting or nausea °o If you cannot keep down clear liquids for longer than 1 day, call your surgeon  °• Abdominal pain that does not get better after taking your pain medication °• Fever over 100.4° F with chills °• Heart beating over 100 beats a minute °• Shortness of breath at rest °• Chest pain °•  Redness, swelling, drainage, or foul odor at incision (surgical) sites °•  If your incisions open or pull apart °• Swelling or pain in calf (lower leg) °• Diarrhea (Loose bowel movements that happen often), frequent watery, uncontrolled bowel movements °• Constipation, (no bowel movements for 3 days) if this happens: Pick one °o Milk of Magnesia, 2 tablespoons by mouth, 3 times a day for 2 days if needed °o Stop taking Milk of Magnesia once you have a bowel movement °o Call your doctor if constipation continues °Or °o Miralax  (instead of Milk of Magnesia) following the label instructions °o Stop taking Miralax once you have a bowel movement °o Call your doctor if constipation continues °• Anything you think is not normal °  °Normal side effects after surgery: • Unable to sleep at night or unable to focus °• Irritability or moody °• Being tearful (crying) or depressed °These are common complaints, possibly related to your anesthesia medications that put you to sleep, stress of surgery, and change in lifestyle.  This usually goes away a few weeks after surgery.  If these feelings continue, call your primary care doctor. °  °Wound Care: You may have surgical glue, steri-strips, or staples over your incisions after  surgery °• Surgical glue:  Looks like a clear film over your incisions and will wear off a little at a time °• Steri-strips: Strips of tape over your incisions. You may notice a yellowish color on the skin under the steri-strips. This is used to make the   steri-strips stick better. Do not pull the steri-strips off - let them fall off °• Staples: Staples may be removed before you leave the hospital °o If you go home with staples, call [facility], [phone] at for an appointment with your surgeon’s nurse to have staples removed 10 days after surgery. °• Showering: You may shower two (2) days after your surgery unless your surgeon tells you differently °o Wash gently around incisions with warm soapy water, rinse well, and gently pat dry  °o No tub baths until staples are removed, steri-strips fall off or glue is gone.  °  °Medications: • Medications should be liquid or crushed if larger than the size of a dime °• Extended release pills (medication that release a little bit at a time through the day) should NOT be crushed or cut. (examples include XL, ER, DR, SR) °• Depending on the size and number of medications you take, you may need to space (take a few throughout the day)/change the time you take your medications so that you do not over-fill your pouch (smaller stomach) °• Make sure you follow-up with your primary care doctor to   make medication changes needed during rapid weight loss and life-style changes °• If you have diabetes, follow up with the doctor that orders your diabetes medication(s) within one week after surgery and check your blood sugar regularly. °• Do not drive while taking prescription pain medication  °• It is ok to take Tylenol by the bottle instructions with your pain medicine or instead of your pain medicine as needed.  DO NOT TAKE NSAIDS (EXAMPLES OF NSAIDS:  IBUPROFREN/ NAPROXEN)  °Diet:                    First 2 Weeks ° You will see the dietician t about two (2) weeks  after your surgery. The dietician will increase the types of foods you can eat if you are handling liquids well: °• If you have severe vomiting or nausea and cannot keep down clear liquids lasting longer than 1 day, call your surgeon @ ([phone]) °Protein Shake °• Drink at least 2 ounces of shake 5-6 times per day °• Each serving of protein shakes (usually 8 - 12 ounces) should have: °o 15 grams of protein  °o And no more than 5 grams of carbohydrate  °• Goal for protein each day: °o Men = 80 grams per day °o Women = 60 grams per day °• Protein powder may be added to fluids such as non-fat milk or Lactaid milk or unsweetened Soy/Almond milk (limit to 35 grams added protein powder per serving) ° °Hydration °• Slowly increase the amount of water and other clear liquids as tolerated (See Acceptable Fluids) °• Slowly increase the amount of protein shake as tolerated  °•  Sip fluids slowly and throughout the day.  Do not use straws. °• May use sugar substitutes in small amounts (no more than 6 - 8 packets per day; i.e. Splenda) ° °Fluid Goal °• The first goal is to drink at least 8 ounces of protein shake/drink per day (or as directed by the nutritionist); some examples of protein shakes are Syntrax Nectar, Adkins Advantage, EAS Edge HP, and Unjury. See handout from pre-op Bariatric Education Class: °o Slowly increase the amount of protein shake you drink as tolerated °o You may find it easier to slowly sip shakes throughout the day °o It is important to get your proteins in first °• Your fluid goal is to drink 64 - 100 ounces of fluid daily °o It may take a few weeks to build up to this °• 32 oz (or more) should be clear liquids  °And  °• 32 oz (or more) should be full liquids (see below for examples) °• Liquids should not contain sugar, caffeine, or carbonation ° °Clear Liquids: °• Water or Sugar-free flavored water (i.e. Fruit H2O, Propel) °• Decaffeinated coffee or tea (sugar-free) °• Crystal Lite, Wyler’s Lite,  Minute Maid Lite °• Sugar-free Jell-O °• Bouillon or broth °• Sugar-free Popsicle:   *Less than 20 calories each; Limit 1 per day ° °Full Liquids: °Protein Shakes/Drinks + 2 choices per day of other full liquids °• Full liquids must be: °o No More Than 15 grams of Carbs per serving  °o No More Than 3 grams of Fat per serving °• Strained low-fat cream soup (except Cream of Potato or Tomato) °• Non-Fat milk °• Fat-free Lactaid Milk °• Unsweetened Soy Or Unsweetened Almond Milk °• Low Sugar yogurt (Dannon Lite & Fit, Greek yogurt; Oikos Triple Zero; Chobani Simply 100; Yoplait 100 calorie Greek - No Fruit on the Bottom) ° °  °Vitamins   and Minerals • Start 1 day after surgery unless otherwise directed by your surgeon °• 2 Chewable Bariatric Specific Multivitamin / Multimineral Supplement with iron (Example: Bariatric Advantage Multi EA) °• Chewable Calcium with Vitamin D-3 °(Example: 3 Chewable Calcium Plus 600 with Vitamin D-3) °o Take 500 mg three (3) times a day for a total of 1500 mg each day °o Do not take all 3 doses of calcium at one time as it may cause constipation, and you can only absorb 500 mg  at a time  °o Do not mix multivitamins containing iron with calcium supplements; take 2 hours apart °• Menstruating women and those with a history of anemia (a blood disease that causes weakness) may need extra iron °o Talk with your doctor to see if you need more iron °• Do not stop taking or change any vitamins or minerals until you talk to your dietitian or surgeon °• Your Dietitian and/or surgeon must approve all vitamin and mineral supplements °  °Activity and Exercise: Limit your physical activity as instructed by your doctor.  It is important to continue walking at home.  During this time, use these guidelines: °• Do not lift anything greater than ten (10) pounds for at least two (2) weeks °• Do not go back to work or drive until your surgeon says you can °• You may have sex when you feel comfortable  °o It is  VERY important for female patients to use a reliable birth control method; fertility often increases after surgery  °o All hormonal birth control will be ineffective for 30 days after surgery due to medications given during surgery a barrier method must be used. °o Do not get pregnant for at least 18 months °• Start exercising as soon as your doctor tells you that you can °o Make sure your doctor approves any physical activity °• Start with a simple walking program °• Walk 5-15 minutes each day, 7 days per week.  °• Slowly increase until you are walking 30-45 minutes per day °Consider joining our BELT program. [phone] or email [email or web address] °  °Special Instructions Things to remember: °• Use your CPAP when sleeping if this applies to you ° °• [facility] has two free Bariatric Surgery Support Groups that meet monthly °o The 3rd Thursday of each month, 6 pm, [facility]  °o The 2nd Friday of each month, 11:45 am in the private dining room in the basement of  °• It is very important to keep all follow up appointments with your surgeon, dietitian, primary care physician, and behavioral health practitioner °• Routine follow up schedule with your surgeon include appointments at 2-3 weeks, 6-8 weeks, 6 months, and 1 year at a minimum.  Your surgeon may request to see you more often.   °o After the first year, please follow up with your bariatric surgeon and dietitian at least once a year in order to maintain best weight loss results °[facility]: [phone] °[facility]: [phone] °Bariatric Nurse Coordinator: [phone] °  °   Reviewed and Endorsed  °by [facility] Patient Education Committee, June, 2016 °Edits Approved: Aug, 2018 ° ° ° °

## 2019-04-10 NOTE — Progress Notes (Signed)
Discussed post op day goals with patient including ambulation, IS, diet progression, pain, and nausea control.  BSTOP education provided including BSTOP information guide, "Guide for Pain Management after your Bariatric Procedure".  Questions answered. 

## 2019-04-10 NOTE — Anesthesia Postprocedure Evaluation (Signed)
Anesthesia Post Note  Patient: Audrey Coleman  Procedure(s) Performed: LAPAROSCOPIC GASTRIC SLEEVE RESECTION, Upper Endo, ERAS Pathway (N/A Abdomen)     Patient location during evaluation: PACU Anesthesia Type: General Level of consciousness: awake and alert Pain management: pain level controlled Vital Signs Assessment: post-procedure vital signs reviewed and stable Respiratory status: spontaneous breathing, nonlabored ventilation, respiratory function stable and patient connected to nasal cannula oxygen Cardiovascular status: blood pressure returned to baseline and stable Postop Assessment: no apparent nausea or vomiting Anesthetic complications: no    Last Vitals:  Vitals:   04/10/19 1145 04/10/19 1200  BP: (!) 149/99 (!) 152/97  Pulse: 78 69  Resp: 13 15  Temp:  36.4 C  SpO2: 95% 97%    Last Pain:  Vitals:   04/10/19 1200  TempSrc:   PainSc: Asleep                 Darrold Bezek S

## 2019-04-10 NOTE — Op Note (Signed)
Preoperative diagnosis: laparoscopic sleeve gastrectomy  Postoperative diagnosis: Same   Procedure: Upper endoscopy   Surgeon: Berna Bue, M.D.  Anesthesia: Gen.   Description of procedure: The endoscope was placed in the mouth and oropharynx and under endoscopic vision it was advanced to the esophagogastric junction which was identified at 35 from the teeth.  The pouch was tensely insufflated while the upper abdomen was flooded with irrigation to perform a leak test, which was negative. No bubbles were seen.  The staple line was hemostatic and the lumen was evenly tubular without undue narrowing, obstructive angulation or twisting specifically at the incisura angularis. The lumen was decompressed and the scope was withdrawn without difficulty.    Berna Bue, M.D. General, Bariatric, & Minimally Invasive Surgery [facility]

## 2019-04-10 NOTE — Anesthesia Procedure Notes (Signed)
Procedure Name: Intubation Date/Time: 04/10/2019 9:58 AM Performed by: Maxwell Caul, CRNA Pre-anesthesia Checklist: Patient identified, Emergency Drugs available, Suction available and Patient being monitored Patient Re-evaluated:Patient Re-evaluated prior to induction Oxygen Delivery Method: Circle system utilized Preoxygenation: Pre-oxygenation with 100% oxygen Induction Type: IV induction Ventilation: Mask ventilation without difficulty and Oral airway inserted - appropriate to patient size Laryngoscope Size: Mac and 4 Grade View: Grade I Tube type: Oral Tube size: 7.5 mm Number of attempts: 1 Airway Equipment and Method: Stylet and Oral airway Placement Confirmation: ETT inserted through vocal cords under direct vision,  positive ETCO2 and breath sounds checked- equal and bilateral Secured at: 21 cm Tube secured with: Tape Dental Injury: Teeth and Oropharynx as per pre-operative assessment

## 2019-04-11 LAB — COMPREHENSIVE METABOLIC PANEL
ALT: 12 U/L (ref 0–44)
AST: 19 U/L (ref 15–41)
Albumin: 3.5 g/dL (ref 3.5–5.0)
Alkaline Phosphatase: 32 U/L — ABNORMAL LOW (ref 38–126)
Anion gap: 8 (ref 5–15)
BUN: 8 mg/dL (ref 6–20)
CO2: 23 mmol/L (ref 22–32)
Calcium: 8.7 mg/dL — ABNORMAL LOW (ref 8.9–10.3)
Chloride: 102 mmol/L (ref 98–111)
Creatinine, Ser: 0.78 mg/dL (ref 0.44–1.00)
GFR calc Af Amer: >60 mL/min (ref >60)
GFR calc non Af Amer: >60 mL/min (ref >60)
Glucose, Bld: 117 mg/dL — ABNORMAL HIGH (ref 70–99)
Potassium: 4.3 mmol/L (ref 3.5–5.1)
Sodium: 133 mmol/L — ABNORMAL LOW (ref 135–145)
Total Bilirubin: 0.6 mg/dL (ref 0.3–1.2)
Total Protein: 6.9 g/dL (ref 6.5–8.1)

## 2019-04-11 LAB — CBC WITH DIFFERENTIAL/PLATELET
Abs Immature Granulocytes: 0.06 10*3/uL (ref 0.00–0.07)
Basophils Absolute: 0 10*3/uL (ref 0.0–0.1)
Basophils Relative: 0 %
Eosinophils Absolute: 0 10*3/uL (ref 0.0–0.5)
Eosinophils Relative: 0 %
HCT: 38.4 % (ref 36.0–46.0)
Hemoglobin: 12.3 g/dL (ref 12.0–15.0)
Immature Granulocytes: 0 %
Lymphocytes Relative: 9 %
Lymphs Abs: 1.6 10*3/uL (ref 0.7–4.0)
MCH: 27.8 pg (ref 26.0–34.0)
MCHC: 32 g/dL (ref 30.0–36.0)
MCV: 86.9 fL (ref 80.0–100.0)
Monocytes Absolute: 0.9 10*3/uL (ref 0.1–1.0)
Monocytes Relative: 5 %
Neutro Abs: 15 10*3/uL — ABNORMAL HIGH (ref 1.7–7.7)
Neutrophils Relative %: 86 %
Platelets: 361 10*3/uL (ref 150–400)
RBC: 4.42 MIL/uL (ref 3.87–5.11)
RDW: 14 % (ref 11.5–15.5)
WBC: 17.7 10*3/uL — ABNORMAL HIGH (ref 4.0–10.5)
nRBC: 0 % (ref 0.0–0.2)

## 2019-04-11 MED ORDER — GABAPENTIN 100 MG PO CAPS: 200.0000 mg | ORAL_CAPSULE | Freq: Two times a day (BID) | ORAL | 0 refills | Status: DC

## 2019-04-11 MED ORDER — OXYCODONE HCL 5 MG PO TABS: 5.0000 mg | ORAL_TABLET | Freq: Four times a day (QID) | ORAL | 0 refills | Status: DC | PRN

## 2019-04-11 MED ORDER — ONDANSETRON 4 MG PO TBDP: 4.0000 mg | ORAL_TABLET | Freq: Four times a day (QID) | ORAL | 0 refills | Status: DC | PRN

## 2019-04-11 MED ORDER — PANTOPRAZOLE SODIUM 40 MG PO TBEC: 40.0000 mg | DELAYED_RELEASE_TABLET | Freq: Every day | ORAL | 0 refills | Status: DC

## 2019-04-11 MED ORDER — ACETAMINOPHEN 500 MG PO TABS: 1000.0000 mg | ORAL_TABLET | Freq: Three times a day (TID) | ORAL | 0 refills | Status: AC

## 2019-04-11 NOTE — Progress Notes (Signed)
Patient alert and oriented, pain is controlled. Patient is tolerating fluids, advanced to protein shake today, patient is tolerating well.  Reviewed Gastric sleeve discharge instructions with patient and patient is able to articulate understanding.  Provided information on BELT program, Support Group and [facility] outpatient pharmacy. All questions answered, will continue to monitor.  Total fluid intake 840 Per dehydration protocol call back one week post op 

## 2019-04-11 NOTE — Discharge Summary (Signed)
Physician Discharge Summary  [name] NFA:[id number] DOB: 1975/08/06 DOA: 04/10/2019  PCP: Jake Samples, PA-C  Admit date: 04/10/2019 Discharge date: 04/11/2019  Recommendations for Outpatient Follow-up:  1.  (include homehealth, outpatient follow-up instructions, specific recommendations for PCP to follow-up on, etc.)  Follow-up Information    Ishmail Mcmanamon, Arta Bruce, MD. Go on 05/04/2019.   Specialty: General Surgery Why: at 1110 am.  Please arrive 15 minutes prior to appointment.  Thank you Contact information: [place] [address] [phone]        [facility], [place]. Go on 06/13/2019.   Specialty: General Surgery Why: Your appointment is with Dr Gurney Maxin at 140 pm.  Please arrive 15 minutes prior to your appointment.  Thank you Contact information: [place] [address] [phone]          Discharge Diagnoses:  Active Problems:   Morbid obesity ([place])   Surgical Procedure: laparoscopic sleeve gastrectomy, upper endoscopy  Discharge Condition: Good Disposition: Home  Diet recommendation: Postoperative sleeve gastrectomy diet (liquids only)  Filed Weights   04/10/19 0741  Weight: 103.4 kg     Hospital Course:  The patient was admitted after undergoing laparoscopic sleeve gastrectomy. POD 0 she ambulated well. POD 1 she was started on the water diet protocol and tolerated 500 ml in the first shift. Once meeting the water amount she was advanced to bariatric protein shakes which they tolerated and were discharged home POD 1.  Treatments: surgery: laparoscopic sleeve gastrectomy  Discharge Instructions  Discharge Instructions    Ambulate hourly while awake   Complete by: As directed    Call MD for:  difficulty breathing, headache or visual disturbances   Complete by: As directed    Call MD for:  persistant dizziness or light-headedness   Complete by: As directed    Call MD for:   persistant nausea and vomiting   Complete by: As directed    Call MD for:  redness, tenderness, or signs of infection (pain, swelling, redness, odor or green/yellow discharge around incision site)   Complete by: As directed    Call MD for:  severe uncontrolled pain   Complete by: As directed    Call MD for:  temperature >101 F   Complete by: As directed    Diet bariatric full liquid   Complete by: As directed    Discharge wound care:   Complete by: As directed    Remove Bandaids tomorrow, ok to shower tomorrow. Steristrips may fall off in 1-3 weeks.   Incentive spirometry   Complete by: As directed    Perform hourly while awake     Allergies as of 04/11/2019   No Known Allergies     Medication List    STOP taking these medications   fluconazole 150 MG tablet Commonly known as: DIFLUCAN   metroNIDAZOLE 500 MG tablet Commonly known as: FLAGYL     TAKE these medications   acetaminophen 500 MG tablet Commonly known as: TYLENOL Take 2 tablets (1,000 mg total) by mouth every 8 (eight) hours for 5 days.   gabapentin 100 MG capsule Commonly known as: NEURONTIN Take 2 capsules (200 mg total) by mouth every 12 (twelve) hours.   ondansetron 4 MG disintegrating tablet Commonly known as: ZOFRAN-ODT Take 1 tablet (4 mg total) by mouth every 6 (six) hours as needed for nausea or vomiting.   oxyCODONE 5 MG immediate release tablet Commonly known as: Oxy IR/ROXICODONE Take  1 tablet (5 mg total) by mouth every 6 (six) hours as needed for severe pain.   pantoprazole 40 MG tablet Commonly known as: PROTONIX Take 1 tablet (40 mg total) by mouth daily.            Discharge Care Instructions  (From admission, onward)         Start     Ordered   04/11/19 0000  Discharge wound care:    Comments: Remove Bandaids tomorrow, ok to shower tomorrow. Steristrips may fall off in 1-3 weeks.   04/11/19 1014         Follow-up Information    Grainger Mccarley, De Blanch, MD. Go on  05/04/2019.   Specialty: General Surgery Why: at 1110 am.  Please arrive 15 minutes prior to appointment.  Thank you Contact information: [address] [place] [address] [phone]        Surgery, [place]. Go on 06/13/2019.   Specialty: General Surgery Why: Your appointment is with Dr Feliciana Rossetti at 140 pm.  Please arrive 15 minutes prior to your appointment.  Thank you Contact information: [address] [place] [address] [phone]            The results of significant diagnostics from this hospitalization (including imaging, microbiology, ancillary and laboratory) are listed below for reference.    Significant Diagnostic Studies: No results found.  Labs: Basic Metabolic Panel: Recent Labs  Lab 04/11/19 0440  NA 133*  K 4.3  CL 102  CO2 23  GLUCOSE 117*  BUN 8  CREATININE 0.78  CALCIUM 8.7*   Liver Function Tests: Recent Labs  Lab 04/11/19 0440  AST 19  ALT 12  ALKPHOS 32*  BILITOT 0.6  PROT 6.9  ALBUMIN 3.5    CBC: Recent Labs  Lab 04/10/19 1133 04/11/19 0440  WBC  --  17.7*  NEUTROABS  --  15.0*  HGB 12.8 12.3  HCT 40.3 38.4  MCV  --  86.9  PLT  --  361    CBG: No results for input(s): GLUCAP in the last 168 hours.  Active Problems:   Morbid obesity ([facility])   VTE plan: no chemical prophylaxis recommended ([email or web address])  Time coordinating discharge: 15 min

## 2019-04-11 NOTE — Progress Notes (Signed)
Patient alert and oriented, Post op day 1.  Provided support and encouragement.  Encouraged pulmonary toilet, ambulation and small sips of liquids. Completed 12 ounces of bari clear fluids and 4 ounces of protein.  All questions answered.  Will continue to monitor.

## 2019-04-12 LAB — SURGICAL PATHOLOGY

## 2019-04-16 ENCOUNTER — Telehealth (HOSPITAL_COMMUNITY): Payer: Self-pay

## 2019-04-16 NOTE — Telephone Encounter (Signed)
Patient called to discuss post bariatric surgery follow up questions.  See below:   1.  Tell me about your pain and pain management?only pain at larger incision, tylenol  2.  Let's talk about fluid intake.  How much total fluid are you taking in?33 ounces of fluids including protein,  Discussed with patient this is a lower than I would like.  We discussed setting timers to remind to drink the next 24 hours. No pain or nausea associated with drinking.  States she is hungry, but knows it more of a mental hunger for things she can not have. Congratulated her on acknowledging that feeling.  We set a goal to increase fluids to 40 ounces today and I will follow up on 3/30 to make sure patient fluid intake has increased to 40 or above.  Denies symptoms of dehydration such as dizziness or dark urine.  3.  How much protein have you taken in the last 2 days?30 grams of protein, patient states it is thick.  Encouraged her to thin it with some milk if needed.  4.  Have you had nausea?  Tell me about when have experienced nausea and what you did to help?no nausea  5.  Has the frequency or color changed with your urine?urine light and frequent  6.  Tell me what your incisions look like?no problems 7.  Have you been passing gas? BM?  8.  If a problem or question were to arise who would you call?  Do you know contact numbers for [facility], [facility], and [facility]?aware of how to contact all services  9.  How has the walking going?walking regularly  10.  How are your vitamins and calcium going?  How are you taking them?starting mvi and calcium day after discharge, no problem

## 2019-04-17 ENCOUNTER — Telehealth (HOSPITAL_COMMUNITY): Payer: Self-pay

## 2019-04-17 NOTE — Telephone Encounter (Signed)
Follow up on patient hydration status.  Yesterday reported only 30-32 ounces of fluid since discharge denied s/s of dehyrations.  Increased fluids the last 24 hours.  See below the list of fluid recall, (total intake 51.3) Contact information provided should questions or resource needs arise. 11.5 oz protein shake 33.8 water 8.0 broth 8.0 skim milk

## 2019-04-24 ENCOUNTER — Other Ambulatory Visit: Payer: Self-pay

## 2019-04-24 ENCOUNTER — Encounter: Payer: BC Managed Care – PPO | Attending: General Surgery | Admitting: Skilled Nursing Facility1

## 2019-04-24 DIAGNOSIS — E669 Obesity, unspecified: Secondary | ICD-10-CM | POA: Insufficient documentation

## 2019-04-25 NOTE — Progress Notes (Signed)
2 Week Post-Operative Nutrition Class   Patient was seen on 03/14/18 for Post-Operative Nutrition education at [facility].    Surgery date: 04/10/2019 Surgery type: sleeve Start weight at [facility]: 217.2 Weight today: 212.1   Body Composition Scale 04/24/2019  Total Body Fat % 43.1  Visceral Fat 14  Fat-Free Mass % 56.8   Total Body Water % 42.9   Muscle-Mass lbs 29.2  Body Fat Displacement          Torso  lbs 56.7         Left Leg  lbs 11.3         Right Leg  lbs 11.3         Left Arm  lbs 5.6         Right Arm   lbs 5.6     The following the learning objectives were met by the patient during this course:  Identifies Phase 3 (Soft, High Proteins) Dietary Goals and will begin from 2 weeks post-operatively to 2 months post-operatively  Identifies appropriate sources of fluids and proteins   States protein recommendations and appropriate sources post-operatively  Identifies the need for appropriate texture modifications, mastication, and bite sizes when consuming solids  Identifies appropriate multivitamin and calcium sources post-operatively  Describes the need for physical activity post-operatively and will follow MD recommendations  States when to call healthcare provider regarding medication questions or post-operative complications   Handouts given during class include:  Phase 3A: Soft, High Protein Diet Handout   Follow-Up Plan: Patient will follow-up at [facility] in 6 weeks for 2 month post-op nutrition visit for diet advancement per MD.

## 2019-04-30 ENCOUNTER — Telehealth: Payer: Self-pay | Admitting: Skilled Nursing Facility1

## 2019-04-30 NOTE — Telephone Encounter (Signed)
RD called pt to verify fluid intake once starting soft, solid proteins 2 week post-bariatric surgery.   Daily Fluid intake: 50+ Daily Protein intake: 60+  Concerns/issues:   None stated 

## 2019-05-04 DIAGNOSIS — R69 Illness, unspecified: Secondary | ICD-10-CM | POA: Diagnosis not present

## 2019-05-04 DIAGNOSIS — K912 Postsurgical malabsorption, not elsewhere classified: Secondary | ICD-10-CM | POA: Diagnosis not present

## 2019-05-04 DIAGNOSIS — R5383 Other fatigue: Secondary | ICD-10-CM | POA: Diagnosis not present

## 2019-05-04 DIAGNOSIS — Z9884 Bariatric surgery status: Secondary | ICD-10-CM | POA: Diagnosis not present

## 2019-06-05 ENCOUNTER — Encounter: Payer: Self-pay | Admitting: Dietician

## 2019-06-05 ENCOUNTER — Encounter: Payer: BC Managed Care – PPO | Attending: General Surgery | Admitting: Dietician

## 2019-06-05 ENCOUNTER — Other Ambulatory Visit: Payer: Self-pay

## 2019-06-05 DIAGNOSIS — E669 Obesity, unspecified: Secondary | ICD-10-CM | POA: Diagnosis not present

## 2019-06-05 NOTE — Progress Notes (Signed)
Bariatric Nutrition Follow-Up Visit Medical Nutrition Therapy   2 Months Post-Operative Sleeve Gastrectomy Surgery Surgery Date: 04/10/2019  Pt's Expectations of Surgery/ Goals: to maintain weight lost, would like to weigh ~140 lbs  Pt Reported Successes: weight loss, sticking to smaller portion sizes   NUTRITION ASSESSMENT  Anthropometrics  Start weight at [facility]: 217.2 lbs (date: 10/05/2018) Today's weight: 200.3 lbs  Body Composition Scale 04/24/2019 06/05/2019  Weight  lbs 212.1 200.3  BMI 40.1 37.9  Total Body Fat  % 43.1 41.6     Visceral Fat 14 13  Fat-Free Mass  % 56.8 58.3     Total Body Water  % 42.9 43.6     Muscle-Mass  lbs 29.2 29.1  Body Fat Displacement --- ---         Torso  lbs 56.7 51.6         Left Leg  lbs 11.3 10.3         Right Leg  lbs 11.3 10.3         Left Arm  lbs 5.6 5.1         Right Arm  lbs 5.6 5.1    Lifestyle & Dietary Hx No issues reported. Will typically have at least 1 protein shake per day, usually 2 (breakfast and dinner.) May have eggs, salmon, shrimp, or steak for lunch but usually chicken. Typical meal pattern is 3 meals per day. Drinks water or flavored water.    24-Hr Dietary Recall First Meal: protein shake  Snack: -  Second Meal: chicken  Snack: -  Third Meal: protein shake  Snack: -  Beverages: water, Mio  Estimated daily fluid intake: 64 oz Estimated daily protein intake: 60 g Supplements: bariatric MVI, calcium  Current average weekly physical activity: walking   Post-Op Goals/ Signs/ Symptoms Using straws: no Drinking while eating: no Chewing/swallowing difficulties: no Changes in vision: no Changes to mood/headaches: no Hair loss/changes to skin/nails: no Difficulty focusing/concentrating: no Sweating: no Dizziness/lightheadedness: no Palpitations: no  Carbonated/caffeinated beverages: no N/V/D/C/Gas: constipation  Abdominal pain: no Dumping syndrome: no   NUTRITION DIAGNOSIS  Overweight/obesity ([place]-3.3)  related to past poor dietary habits and physical inactivity as evidenced by completed bariatric surgery and following dietary guidelines for continued weight loss and healthy nutrition status.   NUTRITION INTERVENTION Nutrition counseling (C-1) and education (E-2) to facilitate bariatric surgery goals, including: . Diet advancement to the next phase (phase 4) now including non-starchy vegetables . The importance of consuming adequate calories as well as certain nutrients daily due to the body's need for essential vitamins, minerals, and fats . The importance of daily physical activity and to reach a goal of at least 150 minutes of moderate to vigorous physical activity weekly (or as directed by their physician) due to benefits such as increased musculature and improved lab values  Handouts Provided Include   Phase 4: Protein + Non-Starchy Vegetables   Learning Style & Readiness for Change Teaching method utilized: Visual & Auditory  Demonstrated degree of understanding via: Teach Back  Barriers to learning/adherence to lifestyle change: None Identified    MONITORING & EVALUATION Dietary intake, weekly physical activity, body weight, and goals in 4 months.  Next Steps Patient is to follow-up in 4 months for 6 month post-op follow-up.

## 2019-06-05 NOTE — Patient Instructions (Signed)

## 2019-07-05 DIAGNOSIS — R69 Illness, unspecified: Secondary | ICD-10-CM | POA: Diagnosis not present

## 2019-07-05 DIAGNOSIS — G4700 Insomnia, unspecified: Secondary | ICD-10-CM | POA: Diagnosis not present

## 2019-07-05 DIAGNOSIS — K912 Postsurgical malabsorption, not elsewhere classified: Secondary | ICD-10-CM | POA: Diagnosis not present

## 2019-10-02 ENCOUNTER — Encounter: Payer: BC Managed Care – PPO | Attending: General Surgery | Admitting: Skilled Nursing Facility1

## 2019-10-02 ENCOUNTER — Other Ambulatory Visit: Payer: Self-pay

## 2019-10-02 DIAGNOSIS — E669 Obesity, unspecified: Secondary | ICD-10-CM | POA: Diagnosis not present

## 2019-10-04 NOTE — Progress Notes (Signed)
Follow-up visit:  Post-Operative sleeve Surgery  Medical Nutrition Therapy:  Appt start time: 6:00pm end time:  7:00pm  Primary concerns today: Post-operative Bariatric Surgery Nutrition Management 6 Month Post-Op Class  Start weight at [facility]: 217.2 lbs (date: 10/05/2018)  Sleeve   Body Composition Scale   Weight  lbs 174.3  Total Body Fat  % 37.5     Visceral Fat 10  Fat-Free Mass  % 62.4     Total Body Water  % 45.7     Muscle-Mass  lbs 28.8  BMI 32.9  Body Fat Displacement ---        Torso  lbs 40.3        Left Leg  lbs 8        Right Leg  lbs 8        Left Arm  lbs 4        Right Arm  lbs 4     Information Reviewed/ Discussed During Appointment: -Review of composition scale numbers -Fluid requirements (64-100 ounces) -Protein requirements (60-80g) -Strategies for tolerating diet -Advancement of diet to include Starchy vegetables -Barriers to inclusion of new foods -Inclusion of appropriate multivitamin and calcium supplements  -Exercise recommendations   Fluid intake: adequate   Medications: See List Supplementation: appropriate   Using straws: no Drinking while eating: no Having you been chewing well: yes Chewing/swallowing difficulties: no Changes in vision: no Changes to mood/headaches: no Hair loss/Cahnges to skin/Changes to nails: no Any difficulty focusing or concentrating: no Sweating: no Dizziness/Lightheaded: no Palpitations: no  Carbonated beverages: no N/V/D/C/GAS: no Abdominal Pain: no Dumping syndrome: no  Recent physical activity:  ADL's  Progress Towards Goal(s):  In Progress  Handouts given during visit include:  Phase V diet Progression   Goals Sheet  The Benefits of Exercise are endless.....  Support Group Topics  Pt Chosen Goals: Nutrition Goals: I will eat (a number) __1____ new non-starchy vegetables every week by (specific date) ______december________  Teaching Method Utilized:  Visual Auditory Hands  on  Demonstrated degree of understanding via:  Teach Back   Monitoring/Evaluation:  Dietary intake, exercise, and body weight. Follow up in 3 months for 9 month post-op visit.

## 2019-12-27 DIAGNOSIS — E669 Obesity, unspecified: Secondary | ICD-10-CM | POA: Diagnosis not present

## 2019-12-27 DIAGNOSIS — K912 Postsurgical malabsorption, not elsewhere classified: Secondary | ICD-10-CM | POA: Diagnosis not present

## 2019-12-27 DIAGNOSIS — R69 Illness, unspecified: Secondary | ICD-10-CM | POA: Diagnosis not present

## 2019-12-27 DIAGNOSIS — Z9884 Bariatric surgery status: Secondary | ICD-10-CM | POA: Diagnosis not present

## 2020-01-02 ENCOUNTER — Other Ambulatory Visit: Payer: Self-pay

## 2020-01-02 ENCOUNTER — Encounter: Payer: BC Managed Care – PPO | Attending: General Surgery | Admitting: Skilled Nursing Facility1

## 2020-01-02 NOTE — Progress Notes (Signed)
Follow-up visit:  Post-Operative sleeve Surgery  Medical Nutrition Therapy Primary concerns today: Post-operative Bariatric Surgery Nutrition Management 6 Month Post-Op Class  Start weight at [facility]: 217.2 lbs (date: 10/05/2018)  Sleeve  Pt states she works a rotating shift sp she feel she general tiredness is from that. Pt states she has had some thinning of her hair. Pt states she has been constipated 3 bowel movements a week. Pt states she has never eaten breakfast and will not eat anything at work because it makes her too heavy.  Pt states she wants to be a good role model for her kid so she will start eating more regular chewable meals.  Pt states she thinks she needs to be thinner in order to be at a healthier BMI: Dietitian educated the pt on the BMI being one parameter with there being many other numbers that are WNL that outweigh the BMI.   Pt to come back in 3 months to check in on health status.    Body Composition Scale  01/02/2020  Weight  lbs 174.3 156.8  Total Body Fat  % 37.5 33.9     Visceral Fat 10 8  Fat-Free Mass  % 62.4 66     Total Body Water  % 45.7 47.5     Muscle-Mass  lbs 28.8 28.6  BMI 32.9 29.6  Body Fat Displacement ---         Torso  lbs 40.3 32.8        Left Leg  lbs 8 6.5        Right Leg  lbs 8 6.5        Left Arm  lbs 4 3.2        Right Arm  lbs 4 3.2   24 hr recall Breakfast: protein shakes Snack:  Lunch: fairlife protein shakes Snack: skinny pop popcorn or m and ms Dinner: chicken + mashed potatoes or corn and collard greens  Beverages: water, water + flavoring, coffee + sugar free creamer, snapple-sugar free   Fluid intake: adequate   Medications: See List Supplementation: multi + calcium   Using straws: no Drinking while eating: no Having you been chewing well: yes Chewing/swallowing difficulties: no Changes in vision: no Changes to mood/headaches: no Hair loss/Cahnges to skin/Changes to nails: no Any difficulty focusing or  concentrating: no Sweating: no Dizziness/Lightheaded: no Palpitations: no  Carbonated beverages: no N/V/D/C/GAS: no Abdominal Pain: no Dumping syndrome: no  Recent physical activity:  ADL's: walking at work-15000 steps  Goals: Ensure you are eating at least 2 meals a day that you have to chew For lunch at work at least include pureed non starchy vegetable sin your protein shake  Teaching Method Utilized:  Visual Auditory Hands on  Demonstrated degree of understanding via:  Teach Back   Monitoring/Evaluation:  Dietary intake, exercise, and body weight. Follow up in 3 months for 9 month post-op visit.

## 2020-01-26 ENCOUNTER — Other Ambulatory Visit: Payer: BC Managed Care – PPO

## 2020-01-26 DIAGNOSIS — Z20822 Contact with and (suspected) exposure to covid-19: Secondary | ICD-10-CM

## 2020-01-29 LAB — NOVEL CORONAVIRUS, NAA: SARS-CoV-2, NAA: DETECTED — AB

## 2020-03-04 ENCOUNTER — Ambulatory Visit: Payer: BC Managed Care – PPO | Admitting: Adult Health

## 2020-03-04 ENCOUNTER — Other Ambulatory Visit (HOSPITAL_COMMUNITY)
Admission: RE | Admit: 2020-03-04 | Discharge: 2020-03-04 | Disposition: A | Discharge status: Home / Self Care | Payer: BC Managed Care – PPO | Source: Ambulatory Visit | Attending: Adult Health | Admitting: Adult Health

## 2020-03-04 ENCOUNTER — Other Ambulatory Visit: Payer: Self-pay

## 2020-03-04 ENCOUNTER — Encounter: Payer: Self-pay | Admitting: Adult Health

## 2020-03-04 VITALS — BP 141/92 | HR 67 | Ht 61.25 in | Wt 141.5 lb

## 2020-03-04 DIAGNOSIS — Z30013 Encounter for initial prescription of injectable contraceptive: Secondary | ICD-10-CM | POA: Insufficient documentation

## 2020-03-04 DIAGNOSIS — Z3202 Encounter for pregnancy test, result negative: Secondary | ICD-10-CM | POA: Diagnosis not present

## 2020-03-04 DIAGNOSIS — Z1231 Encounter for screening mammogram for malignant neoplasm of breast: Secondary | ICD-10-CM

## 2020-03-04 DIAGNOSIS — Z113 Encounter for screening for infections with a predominantly sexual mode of transmission: Secondary | ICD-10-CM | POA: Diagnosis not present

## 2020-03-04 LAB — POCT URINE PREGNANCY: Preg Test, Ur: NEGATIVE

## 2020-03-04 MED ORDER — MEDROXYPROGESTERONE ACETATE 150 MG/ML IM SUSP
150.0000 mg | Freq: Once | INTRAMUSCULAR | Status: AC
  Administered 2020-03-04: 150 mg via INTRAMUSCULAR

## 2020-03-04 MED ORDER — MEDROXYPROGESTERONE ACETATE 150 MG/ML IM SUSP: 150.0000 mg | INTRAMUSCULAR | 4 refills | Status: DC

## 2020-03-04 NOTE — Progress Notes (Signed)
  Subjective:     Patient ID: Audrey Coleman, female   DOB: August 29, 1975, 45 y.o.   MRN: [id number]  HPI Audrey Coleman is a 45 year old black female, single G3P3 in requesting STD testing and wants to get back on depo. She had sleeve surgery March 2021, and has lost 90 lbs and has split with SO.  Last pap in 2017.  PCP is Audrey Purser PA.   Review of Systems Patient denies any headaches, hearing loss, fatigue, blurred vision, shortness of breath, chest pain, abdominal pain, problems with bowel movements, urination, or intercourse. No joint pain or mood swings. Reviewed past medical,surgical, social and family history. Reviewed medications and allergies.     Objective:   Physical Exam BP (!) 141/92 (BP Location: Left Arm, Patient Position: Sitting, Cuff Size: Normal)   Pulse 67   Ht 5' 1.25" (1.556 m)   Wt 141 lb 8 oz (64.2 kg)   LMP 02/29/2020   BMI 26.52 kg/m UPT is negative Skin warm and dry. Neck: mid line trachea, normal thyroid, good ROM, no lymphadenopathy noted. Lungs: clear to ausculation bilaterally. Cardiovascular: regular rate and rhythm. Pelvic: external genitalia is normal in appearance no lesions, vagina: pink with good moisture and rugae,urethra has no lesions or masses noted, cervix:smooth and bulbous,CV swab obtained, uterus: normal size, shape and contour, non tender, no masses felt, adnexa: no masses or tenderness noted. Bladder is non tender and no masses felt. Co exam with Audrey Charon NP student. Fall risk is low  [facility] - 03/04/20 1053      Pregnancy Intention Screening   Does the patient want to become pregnant in the next year? No    Does the patient's partner want to become pregnant in the next year? No    Would the patient like to discuss contraceptive options today? No      Contraception Wrap Up   Current Method No Method - Other Reason    End Method Hormonal Injection             Assessment:     1. Pregnancy examination or test, negative  result   2. Encounter for initial prescription of injectable contraceptive To get first injection in office today Meds ordered this encounter  Medications  . medroxyPROGESTERone (DEPO-PROVERA) 150 MG/ML injection    Sig: Inject 1 mL (150 mg total) into the muscle every 3 (three) months.    Dispense:  1 mL    Refill:  4    Order Specific Question:   Supervising Provider    Answer:   Despina Hidden, LUTHER H [[id number]]  . medroxyPROGESTERone (DEPO-PROVERA) injection 150 mg  Next injection in 12 weeks   3. Screening examination for STD (sexually transmitted disease) Check HIV,RPR and hepatitis C antibody CV swab sent for CG/CHL,Trich, BV and yeast   4. Screening mammogram for breast cancer Scheduled mammogram 03/13/20 at 8:30 am at [facility]:     Return in 4 weeks for pap and physical

## 2020-03-05 ENCOUNTER — Other Ambulatory Visit: Payer: Self-pay | Admitting: Adult Health

## 2020-03-05 LAB — CERVICOVAGINAL ANCILLARY ONLY
Bacterial Vaginitis (gardnerella): POSITIVE — AB
Candida Glabrata: POSITIVE — AB
Candida Vaginitis: NEGATIVE
Chlamydia: NEGATIVE
Comment: NEGATIVE
Comment: NEGATIVE
Comment: NEGATIVE
Comment: NEGATIVE
Comment: NEGATIVE
Comment: NORMAL
Neisseria Gonorrhea: NEGATIVE
Trichomonas: NEGATIVE

## 2020-03-05 LAB — HIV ANTIBODY (ROUTINE TESTING W REFLEX): HIV Screen 4th Generation wRfx: NONREACTIVE

## 2020-03-05 LAB — HEPATITIS C ANTIBODY: Hep C Virus Ab: <0.1 s/co ratio (ref 0.0–0.9)

## 2020-03-05 LAB — RPR: RPR Ser Ql: NONREACTIVE

## 2020-03-05 MED ORDER — FLUCONAZOLE 150 MG PO TABS: ORAL_TABLET | ORAL | 1 refills | Status: DC

## 2020-03-05 MED ORDER — METRONIDAZOLE 500 MG PO TABS: 500.0000 mg | ORAL_TABLET | Freq: Two times a day (BID) | ORAL | 0 refills | Status: DC

## 2020-03-06 DIAGNOSIS — Z1389 Encounter for screening for other disorder: Secondary | ICD-10-CM | POA: Diagnosis not present

## 2020-03-06 DIAGNOSIS — L02211 Cutaneous abscess of abdominal wall: Secondary | ICD-10-CM | POA: Diagnosis not present

## 2020-03-06 DIAGNOSIS — R1900 Intra-abdominal and pelvic swelling, mass and lump, unspecified site: Secondary | ICD-10-CM | POA: Diagnosis not present

## 2020-03-06 DIAGNOSIS — E663 Overweight: Secondary | ICD-10-CM | POA: Diagnosis not present

## 2020-03-06 DIAGNOSIS — Z6827 Body mass index (BMI) 27.0-27.9, adult: Secondary | ICD-10-CM | POA: Diagnosis not present

## 2020-03-13 ENCOUNTER — Ambulatory Visit (HOSPITAL_COMMUNITY)
Admission: RE | Admit: 2020-03-13 | Discharge: 2020-03-13 | Disposition: A | Discharge status: Home / Self Care | Payer: BC Managed Care – PPO | Source: Ambulatory Visit | Attending: Adult Health | Admitting: Adult Health

## 2020-03-13 ENCOUNTER — Other Ambulatory Visit: Payer: Self-pay

## 2020-03-13 DIAGNOSIS — Z1231 Encounter for screening mammogram for malignant neoplasm of breast: Secondary | ICD-10-CM | POA: Insufficient documentation

## 2020-03-28 DIAGNOSIS — Z9884 Bariatric surgery status: Secondary | ICD-10-CM | POA: Diagnosis not present

## 2020-03-28 DIAGNOSIS — Z0001 Encounter for general adult medical examination with abnormal findings: Secondary | ICD-10-CM | POA: Diagnosis not present

## 2020-03-28 DIAGNOSIS — G4700 Insomnia, unspecified: Secondary | ICD-10-CM | POA: Diagnosis not present

## 2020-03-28 DIAGNOSIS — Z6827 Body mass index (BMI) 27.0-27.9, adult: Secondary | ICD-10-CM | POA: Diagnosis not present

## 2020-03-28 DIAGNOSIS — E663 Overweight: Secondary | ICD-10-CM | POA: Diagnosis not present

## 2020-04-01 ENCOUNTER — Other Ambulatory Visit: Payer: BC Managed Care – PPO | Admitting: Adult Health

## 2020-04-02 ENCOUNTER — Ambulatory Visit: Payer: BC Managed Care – PPO | Admitting: Skilled Nursing Facility1

## 2020-04-16 ENCOUNTER — Other Ambulatory Visit: Payer: Self-pay

## 2020-04-16 ENCOUNTER — Encounter: Payer: BC Managed Care – PPO | Attending: General Surgery | Admitting: Skilled Nursing Facility1

## 2020-04-16 DIAGNOSIS — E669 Obesity, unspecified: Secondary | ICD-10-CM | POA: Diagnosis not present

## 2020-04-16 NOTE — Progress Notes (Signed)
Follow-up visit:  Post-Operative sleeve Surgery  Medical Nutrition Therapy Primary concerns today: Post-operative Bariatric Surgery Nutrition Management 6 Month Post-Op Class  Start weight at [facility]: 217.2 lbs (date: 10/05/2018) Current weight: 149.8 (04/16/2020) Sleeve  Pt states she started a new birth control (Deppo) and feels she is always hungry on it. Pt is anxious to gain weight with this medication. Pt states no hair thinning noted, she got it cut off. Pt states she has not been depending on her protein shakes and states she started eating more meals after Deppo (birth control). Pt states she doesn't like many vegetables or fruit, but will try to eat them. Pt states she knows the importance of fruits and vegetables for fiber and preventing constipation. Pt states she no longer weighs herself and got rid of her scale because she felt she had lost too much weight. Pt states she is able to recognize mentality of focusing on weight as well as satiety/hunger cues. Pt states that she hasn't had issues with GI distress (constipation). Pt states that she has been walking about 20,000 steps per day. Pt states that she feels she could have vegetables for snacks. Pt states she likes strawberries, apples and grapes. Pt states she doesn't like to combine foods or for foods to touch. Pt likes leafy greens, green beans, cabbage for vegetables.    Body Composition Scale  01/02/2020 04/16/2020  Weight  lbs 174.3 156.8 149.8  Total Body Fat  % 37.5 33.9 32.4     Visceral Fat 10 8 8   Fat-Free Mass  % 62.4 66 67.5     Total Body Water  % 45.7 47.5 48.2     Muscle-Mass  lbs 28.8 28.6 28.5  BMI 32.9 29.6 28.2  Body Fat Displacement ---          Torso  lbs 40.3 32.8 29.9        Left Leg  lbs 8 6.5 5.9        Right Leg  lbs 8 6.5 5.9        Left Arm  lbs 4 3.2 2.9        Right Arm  lbs 4 3.2 2.9   24 hr recall Breakfast: coffee, 2 eggs + ham Snack:  Lunch: 3-4 oz chicken Snack:  Dinner: 3-4 oz chicken   Beverages: water, water + flavoring, coffee + sugar free creamer, snapple-sugar free, sugar-free tea   Fluid intake: at least 64 fl oz water  Medications: See List Supplementation: multi + calcium   Using straws: no Drinking while eating: no Having you been chewing well: yes Chewing/swallowing difficulties: no Changes in vision: no Changes to mood/headaches: no Hair loss/Cahnges to skin/Changes to nails: no Any difficulty focusing or concentrating: no Sweating: no Dizziness/Lightheaded: no Palpitations: no  Carbonated beverages: no N/V/D/C/GAS: no Abdominal Pain: no Dumping syndrome: no  Recent physical activity:  ADL's: walking at work-20,000 steps   NUTRITION INTERVENTION Nutrition counseling (C-1) and education (E-2) to facilitate bariatric surgery goals, including: Educated pt that she can now introduce any carbohydrates and fruits Educated pt on too much fluid potentially leading to early satiety Encouraged pt to eat every 3 hours, snacks between each meal and an after dinner snacks Discussed with pt light yoga or mediation rather than focusing on extra walking  Goals: -Continue: Ensure you are eating at least 2 meals a day that you have to chew -New: For lunch and dinner at work at least include non starchy vegetables -New: Encouraged pt to  have vegetables or fruit as snacks.   Teaching Method Utilized:  Visual Auditory Hands on  Demonstrated degree of understanding via:  Teach Back   Monitoring/Evaluation:  Dietary intake, exercise, and body weight. Follow up in 1 month.

## 2020-05-05 ENCOUNTER — Other Ambulatory Visit: Payer: Self-pay | Admitting: Adult Health

## 2020-05-05 MED ORDER — MEGESTROL ACETATE 40 MG PO TABS
ORAL_TABLET | ORAL | 1 refills | Status: DC
Start: 2020-05-05 — End: 2022-03-25

## 2020-05-05 NOTE — Progress Notes (Signed)
rx megace  

## 2020-05-06 ENCOUNTER — Ambulatory Visit: Payer: BC Managed Care – PPO | Admitting: Plastic Surgery

## 2020-05-06 ENCOUNTER — Encounter: Payer: Self-pay | Admitting: Plastic Surgery

## 2020-05-06 ENCOUNTER — Other Ambulatory Visit: Payer: Self-pay

## 2020-05-06 VITALS — BP 138/90 | HR 78 | Ht 61.0 in | Wt 157.4 lb

## 2020-05-06 DIAGNOSIS — M793 Panniculitis, unspecified: Secondary | ICD-10-CM | POA: Diagnosis not present

## 2020-05-06 DIAGNOSIS — E669 Obesity, unspecified: Secondary | ICD-10-CM | POA: Diagnosis not present

## 2020-05-06 IMAGING — CR DG CHEST 2V
2 series · 2 of 2 positions shown · non-contrast
Comparison: None.

CLINICAL DATA: Preop.

EXAM:
CHEST - 2 VIEW

[chest pa]
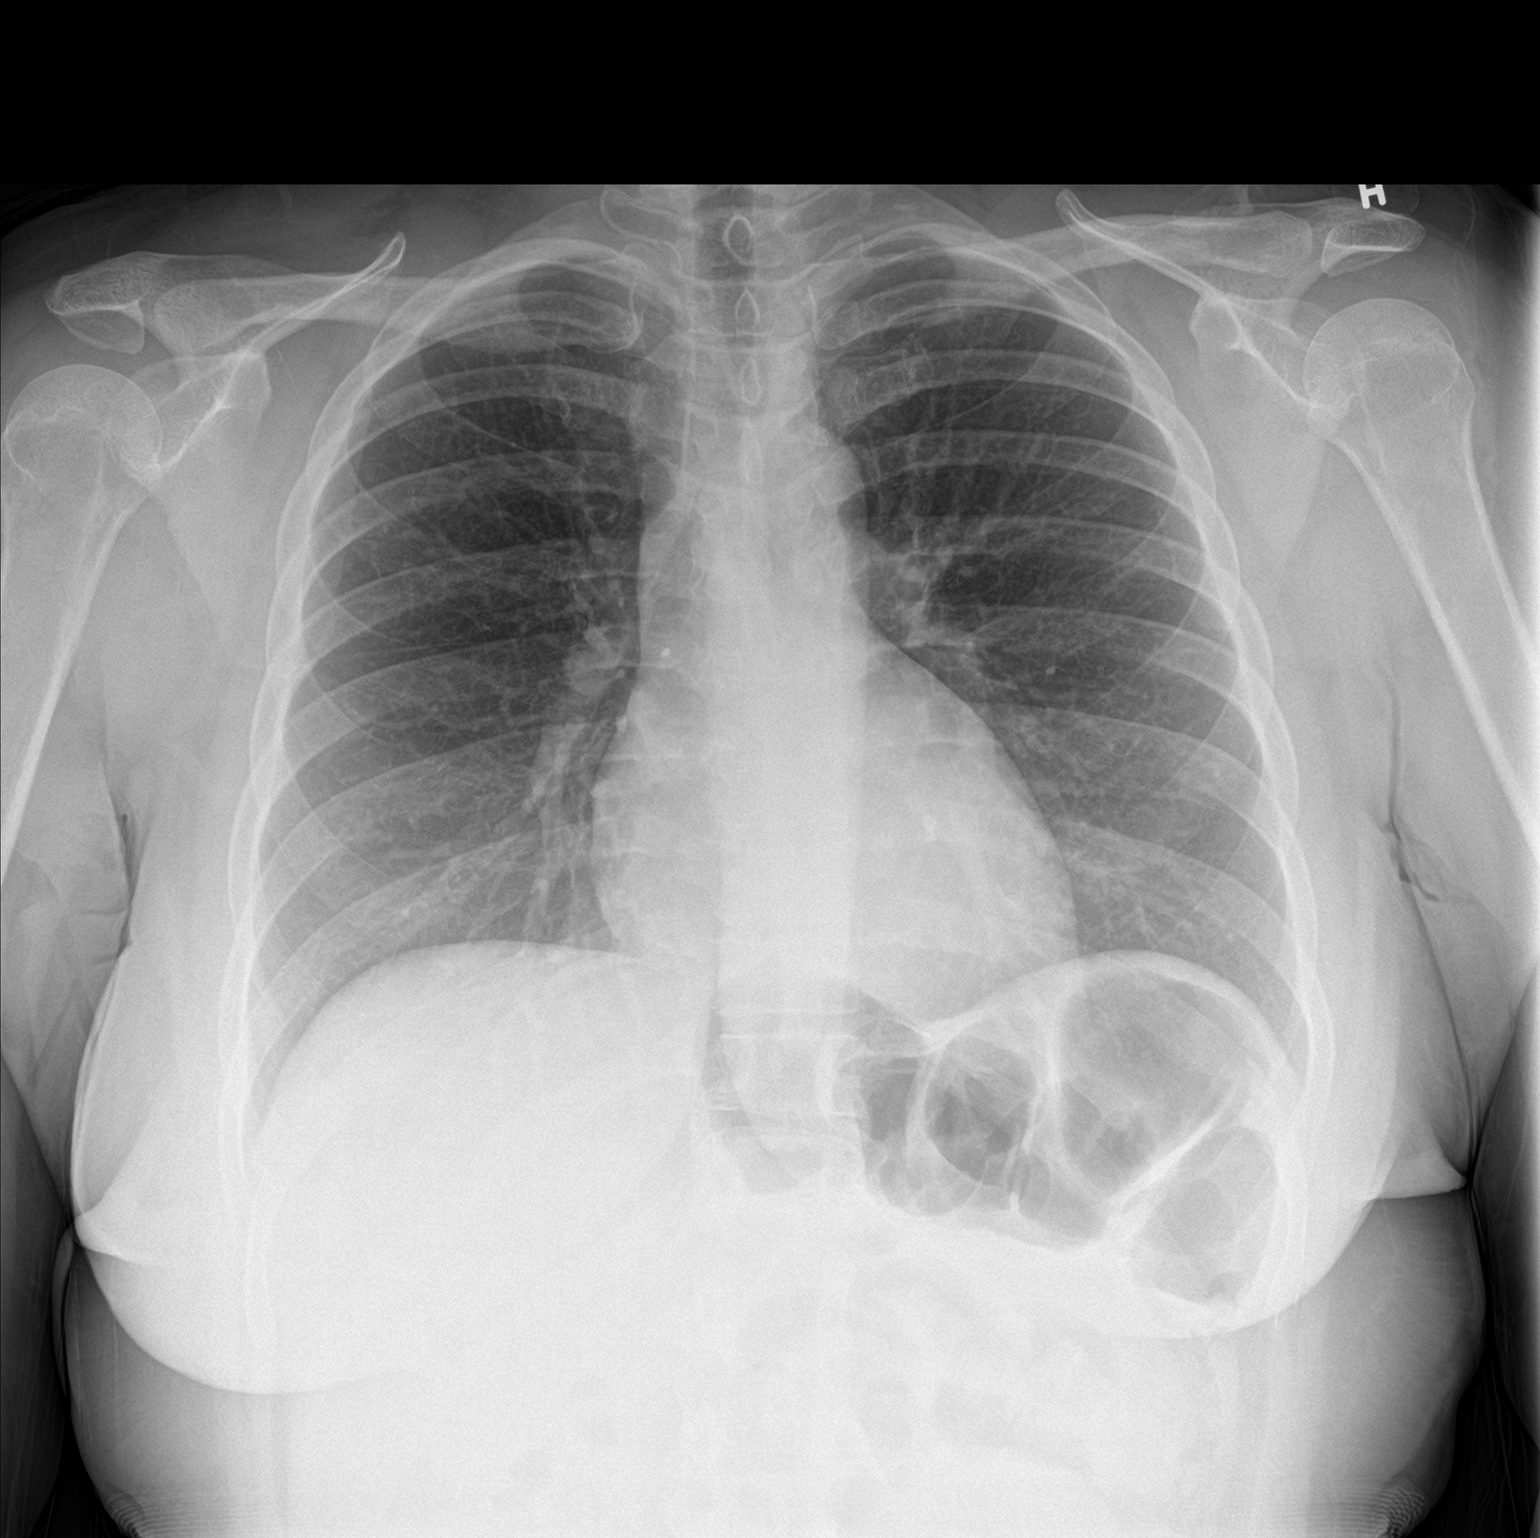

[chest lat]
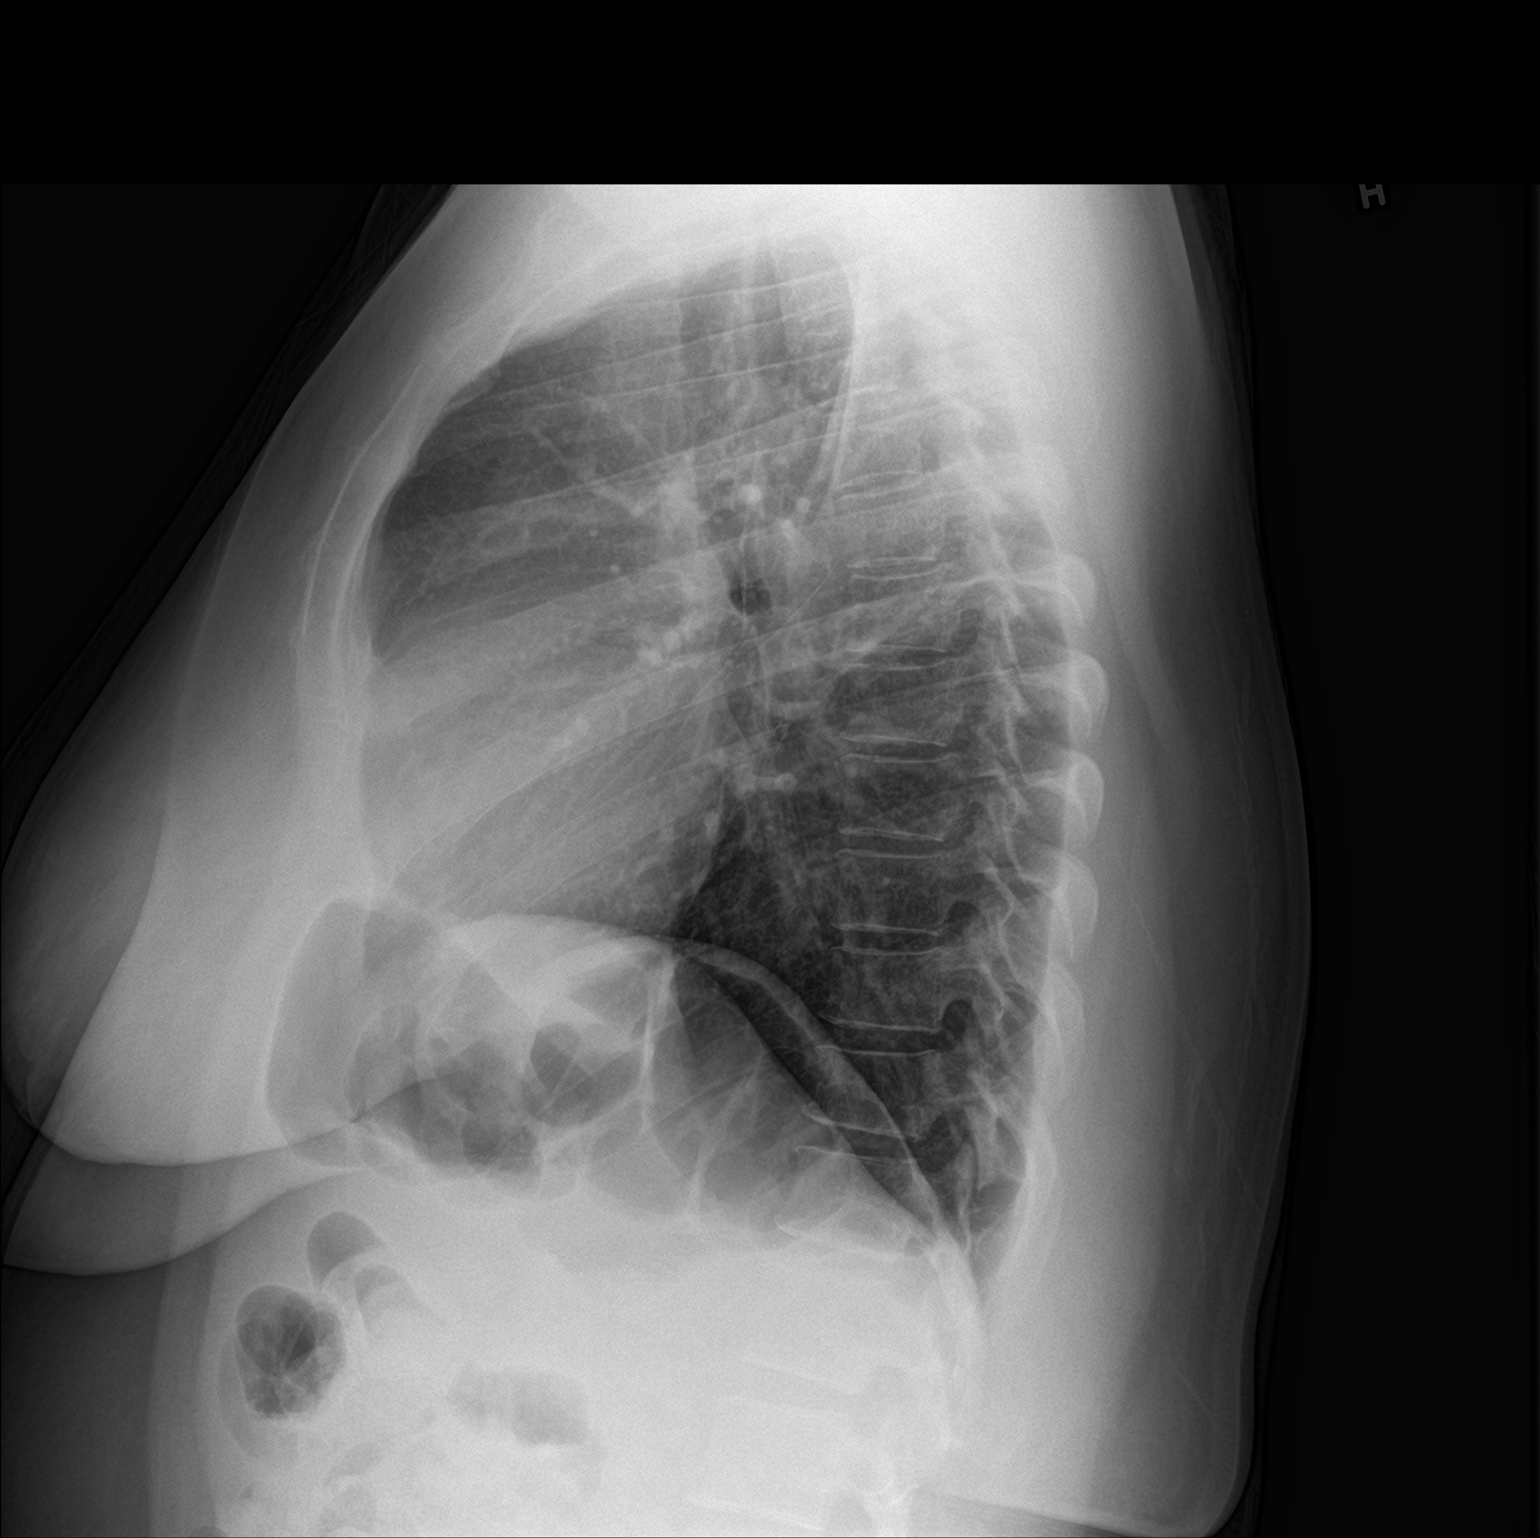

[2 of 2 positions shown; findings below may reference images not displayed]

FINDINGS: The heart size and mediastinal contours are within normal limits.
Both lungs are clear. The visualized skeletal structures are
unremarkable.
IMPRESSION: No acute cardiopulmonary disease.

## 2020-05-06 NOTE — Progress Notes (Signed)
Patient ID: Audrey Coleman, female    DOB: 09-Mar-1975, 45 y.o.   MRN: [id number]   Chief Complaint  Patient presents with  . Advice Only    The patient is a 45 year old female here for evaluation of her abdomen.  She underwent a gastric sleeve in March 2021 by Dr. Sheliah Hatch.  She is not a smoker and does not have diabetes.  Her original weight was 235 pounds and she is decreased at 257 pounds.  She has been stable for the last 4 months with her weight.  She is very pleased with her progress.  She has not had physical therapy yet.  She is interested in reducing the overall size of her pannus.  She is 5 feet 1 inch tall.  She has a lot of skin laxity without significant or excessive fat.  I do not feel a hernia.  She has no sign of infection or hematoma /seroma.  Complains of some back pain due to the excess tissue.  She is planning to be around this weight.  She complains about rashes and odor in her curves of the pannus due to the excess skin.  Her past medical history is positive for hypertension and her last hemoglobin A1c was December 2021 and was 4.9.   Review of Systems  Constitutional: Positive for activity change. Negative for appetite change.  Eyes: Negative.   Respiratory: Negative.  Negative for chest tightness and shortness of breath.   Cardiovascular: Negative for leg swelling.  Gastrointestinal: Negative for abdominal distention and abdominal pain.  Endocrine: Negative.   Genitourinary: Negative.   Musculoskeletal: Positive for back pain.  Skin: Positive for color change and rash.  Neurological: Negative.   Hematological: Negative.   Psychiatric/Behavioral: Negative.     Past Medical History:  Diagnosis Date  . Abnormal uterine bleeding (AUB) 08/26/2015  . Hypertension     Past Surgical History:  Procedure Laterality Date  . LAPAROSCOPIC GASTRIC SLEEVE RESECTION N/A 04/10/2019   Procedure: LAPAROSCOPIC GASTRIC SLEEVE RESECTION, Upper Endo, ERAS Pathway;  Surgeon:  Kinsinger, De Blanch, MD;  Location: [facility];  Service: General;  Laterality: N/A;  . NO PAST SURGERIES        Current Outpatient Medications:  .  CALCIUM PO, Take by mouth in the morning, at noon, and at bedtime., Disp: , Rfl:  .  medroxyPROGESTERone (DEPO-PROVERA) 150 MG/ML injection, Inject 1 mL (150 mg total) into the muscle every 3 (three) months., Disp: 1 mL, Rfl: 4 .  megestrol (MEGACE) 40 MG tablet, Take 3 x 5 days then 2 x 5 days then 1 daily til bleeding stops, Disp: 45 tablet, Rfl: 1 .  metroNIDAZOLE (FLAGYL) 500 MG tablet, Take 1 tablet (500 mg total) by mouth 2 (two) times daily., Disp: 14 tablet, Rfl: 0 .  UNABLE TO FIND, Bariatric advantage-takes 2 daily, Disp: , Rfl:    Objective:   Vitals:   05/06/20 1329  BP: 138/90  Pulse: 78  SpO2: 99%    Physical Exam Vitals and nursing note reviewed.  Constitutional:      Appearance: Normal appearance.  HENT:     Head: Normocephalic and atraumatic.  Cardiovascular:     Rate and Rhythm: Normal rate.     Pulses: Normal pulses.  Pulmonary:     Effort: Pulmonary effort is normal. No respiratory distress.  Abdominal:     General: Abdomen is flat. There is no distension.     Palpations: There is no mass.  Tenderness: There is no guarding.     Hernia: No hernia is present.  Skin:    General: Skin is warm.     Capillary Refill: Capillary refill takes less than 2 seconds.     Coloration: Skin is not jaundiced.     Findings: No bruising.  Neurological:     General: No focal deficit present.     Mental Status: She is alert and oriented to person, place, and time.  Psychiatric:        Mood and Affect: Mood normal.        Behavior: Behavior normal.        Thought Content: Thought content normal.        Judgment: Judgment normal.     Assessment & Plan:  Obesity, unspecified classification, unspecified obesity type, unspecified whether serious comorbidity present  Panniculitis  I think the patient would be a very  good candidate for a panniculectomy and even a fleur-de-lis abdominoplasty with liposuction.  She is done really well with her weight loss.  I think that physical therapy would be beneficial due to her back pain and the patient is willing.  She knows to give [place] a call when she has finished her physical therapy.  Pictures were obtained of the patient and placed in the chart with the patient's or guardian's permission.   Alena Bills Abygale Karpf, DO

## 2020-05-15 DIAGNOSIS — E65 Localized adiposity: Secondary | ICD-10-CM | POA: Diagnosis not present

## 2020-05-21 ENCOUNTER — Encounter: Payer: BC Managed Care – PPO | Admitting: Skilled Nursing Facility1

## 2020-05-22 ENCOUNTER — Ambulatory Visit (INDEPENDENT_AMBULATORY_CARE_PROVIDER_SITE_OTHER): Payer: BC Managed Care – PPO | Admitting: Adult Health

## 2020-05-22 ENCOUNTER — Other Ambulatory Visit (HOSPITAL_COMMUNITY)
Admission: RE | Admit: 2020-05-22 | Discharge: 2020-05-22 | Disposition: A | Discharge status: Home / Self Care | Payer: BC Managed Care – PPO | Source: Ambulatory Visit | Attending: Adult Health | Admitting: Adult Health

## 2020-05-22 ENCOUNTER — Encounter: Payer: Self-pay | Admitting: Adult Health

## 2020-05-22 ENCOUNTER — Other Ambulatory Visit: Payer: Self-pay

## 2020-05-22 VITALS — BP 136/89 | HR 70 | Ht 62.0 in | Wt 164.0 lb

## 2020-05-22 DIAGNOSIS — R87810 Cervical high risk human papillomavirus (HPV) DNA test positive: Secondary | ICD-10-CM | POA: Diagnosis not present

## 2020-05-22 DIAGNOSIS — Z01419 Encounter for gynecological examination (general) (routine) without abnormal findings: Secondary | ICD-10-CM | POA: Insufficient documentation

## 2020-05-22 DIAGNOSIS — Z3042 Encounter for surveillance of injectable contraceptive: Secondary | ICD-10-CM | POA: Insufficient documentation

## 2020-05-22 DIAGNOSIS — Z113 Encounter for screening for infections with a predominantly sexual mode of transmission: Secondary | ICD-10-CM | POA: Diagnosis not present

## 2020-05-22 DIAGNOSIS — B369 Superficial mycosis, unspecified: Secondary | ICD-10-CM | POA: Diagnosis not present

## 2020-05-22 DIAGNOSIS — Z1211 Encounter for screening for malignant neoplasm of colon: Secondary | ICD-10-CM | POA: Insufficient documentation

## 2020-05-22 DIAGNOSIS — Z1151 Encounter for screening for human papillomavirus (HPV): Secondary | ICD-10-CM | POA: Insufficient documentation

## 2020-05-22 LAB — HEMOCCULT GUIAC POC 1CARD (OFFICE): Fecal Occult Blood, POC: NEGATIVE

## 2020-05-22 MED ORDER — NYSTATIN 100000 UNIT/GM EX POWD: 1.0000 "application " | Freq: Three times a day (TID) | CUTANEOUS | 3 refills | Status: DC

## 2020-05-22 NOTE — Progress Notes (Signed)
Patient ID: Audrey Coleman, female   DOB: 1975/02/05, 45 y.o.   MRN: [id number] History of Present Illness: Audrey Coleman is a 45 year old black female,single, G3P3 in for a well woman gyn exam and pap.  She has rash under panniculus, had gastric sleeve and lost weight and has redundant skin and sweats. PCP is Terie Purser PA   Current Medications, Allergies, Past Medical History, Past Surgical History, Family History and Social History were reviewed in [employer] record.     Review of Systems:  Patient denies any headaches, hearing loss, fatigue, blurred vision, shortness of breath, chest pain, abdominal pain, problems with bowel movements, urination, or intercourse. No joint pain or mood swings. Rash under panniculus, took anitbiotic   Physical Exam:BP 136/89 (BP Location: Left Arm, Patient Position: Sitting, Cuff Size: Normal)   Pulse 70   Ht 5\' 2"  (1.575 m)   Wt 164 lb (74.4 kg)   LMP 04/22/2020 (Approximate)   BMI 30.00 kg/m  General:  Well developed, well nourished, no acute distress Skin:  Warm and dry Neck:  Midline trachea, normal thyroid, good ROM, no lymphadenopathy Lungs; Clear to auscultation bilaterally Breast:  No dominant palpable mass, retraction, or nipple discharge Cardiovascular: Regular rate and rhythm Abdomen:  Soft, non tender, no hepatosplenomegaly, slight skin discoloration under panniculus, like yeast Pelvic:  External genitalia is normal in appearance, no lesions.  The vagina is normal in appearance. Urethra has no lesions or masses. The cervix is bulbous.Pap with GC/CHL and HR HPV genotyping performed.  Uterus is felt to be normal size, shape, and contour.  No adnexal masses or tenderness noted.Bladder is non tender, no masses felt. Rectal: Good sphincter tone, no polyps, or hemorrhoids felt.  Hemoccult negative. Extremities/musculoskeletal:  No swelling or varicosities noted, no clubbing or cyanosis Psych:  No mood changes, alert and  cooperative,seems happy AA is 0  Fall risk is low PHQ 9 score is 4 GAD 7 score is 1  [facility] - 05/22/20 0915      Pregnancy Intention Screening   Does the patient want to become pregnant in the next year? No    Does the patient's partner want to become pregnant in the next year? No    Would the patient like to discuss contraceptive options today? No      Contraception Wrap Up   Current Method Hormonal Injection    End Method Hormonal Injection    Contraception Counseling Provided No         Examination chaperoned by 07/22/20 LPN  Impression and Plan: 1. Encounter for gynecological examination with Papanicolaou smear of cervix Pap sent Physical in 1 year Pap in 3 if normal Labs with PCP  Has mammogram 05/26/20 Colonoscopy or cologuard at 46   2. Encounter for screening fecal occult blood testing  3. Superficial fungus infection of skin Will rx nystatin powders Meds ordered this encounter  Medications  . nystatin (MYCOSTATIN/NYSTOP) powder    Sig: Apply 1 application topically 3 (three) times daily.    Dispense:  60 g    Refill:  3    Order Specific Question:   Supervising Provider    Answer:   EURE, LUTHER H [[id number]]    4. Encounter for surveillance of injectable contraceptive Depo due next week, has refills

## 2020-05-26 ENCOUNTER — Ambulatory Visit (HOSPITAL_COMMUNITY)
Admission: RE | Admit: 2020-05-26 | Discharge: 2020-05-26 | Disposition: A | Discharge status: Home / Self Care | Payer: BC Managed Care – PPO | Source: Ambulatory Visit | Attending: Adult Health | Admitting: Adult Health

## 2020-05-26 DIAGNOSIS — Z1231 Encounter for screening mammogram for malignant neoplasm of breast: Secondary | ICD-10-CM | POA: Insufficient documentation

## 2020-05-27 ENCOUNTER — Ambulatory Visit: Payer: BC Managed Care – PPO

## 2020-05-29 ENCOUNTER — Other Ambulatory Visit: Payer: Self-pay

## 2020-05-29 ENCOUNTER — Ambulatory Visit (INDEPENDENT_AMBULATORY_CARE_PROVIDER_SITE_OTHER): Payer: BC Managed Care – PPO | Admitting: *Deleted

## 2020-05-29 DIAGNOSIS — Z3042 Encounter for surveillance of injectable contraceptive: Secondary | ICD-10-CM | POA: Diagnosis not present

## 2020-05-29 LAB — CYTOLOGY - PAP
Adequacy: ABSENT
Chlamydia: NEGATIVE
Comment: NEGATIVE
Comment: NEGATIVE
Comment: NEGATIVE
Comment: NORMAL
Diagnosis: NEGATIVE
HPV 16: NEGATIVE
HPV 18 / 45: NEGATIVE
High risk HPV: POSITIVE — AB
Neisseria Gonorrhea: NEGATIVE

## 2020-05-29 MED ORDER — MEDROXYPROGESTERONE ACETATE 150 MG/ML IM SUSP
150.0000 mg | Freq: Once | INTRAMUSCULAR | Status: AC
  Administered 2020-05-29: 150 mg via INTRAMUSCULAR

## 2020-05-29 NOTE — Progress Notes (Signed)
   NURSE VISIT- INJECTION  SUBJECTIVE:  Audrey Coleman is a 45 y.o. [id number] female here for a Depo Provera for contraception/period management. She is a GYN patient.   OBJECTIVE:  There were no vitals taken for this visit.  Appears well, in no apparent distress  Injection administered in: Right deltoid  Meds ordered this encounter  Medications  . medroxyPROGESTERone (DEPO-PROVERA) injection 150 mg    ASSESSMENT: GYN patient Depo Provera for contraception/period management PLAN: Follow-up: in 11-13 weeks for next Depo   Malachy Mood  05/29/2020 9:52 AM

## 2020-06-03 ENCOUNTER — Encounter: Payer: Self-pay | Admitting: Adult Health

## 2020-06-03 DIAGNOSIS — R87810 Cervical high risk human papillomavirus (HPV) DNA test positive: Secondary | ICD-10-CM

## 2020-06-03 HISTORY — DX: Cervical high risk human papillomavirus (HPV) DNA test positive: R87.810

## 2020-08-14 ENCOUNTER — Ambulatory Visit: Payer: BC Managed Care – PPO

## 2020-08-26 DIAGNOSIS — M793 Panniculitis, unspecified: Secondary | ICD-10-CM | POA: Diagnosis not present

## 2020-09-10 DIAGNOSIS — E65 Localized adiposity: Secondary | ICD-10-CM | POA: Diagnosis not present

## 2020-09-10 DIAGNOSIS — M793 Panniculitis, unspecified: Secondary | ICD-10-CM | POA: Diagnosis not present

## 2020-09-10 DIAGNOSIS — Z9884 Bariatric surgery status: Secondary | ICD-10-CM | POA: Diagnosis not present

## 2020-09-11 DIAGNOSIS — E65 Localized adiposity: Secondary | ICD-10-CM | POA: Diagnosis not present

## 2020-09-11 DIAGNOSIS — Z9884 Bariatric surgery status: Secondary | ICD-10-CM | POA: Diagnosis not present

## 2020-11-06 ENCOUNTER — Encounter (HOSPITAL_COMMUNITY): Payer: Self-pay | Admitting: *Deleted

## 2021-03-10 ENCOUNTER — Other Ambulatory Visit (INDEPENDENT_AMBULATORY_CARE_PROVIDER_SITE_OTHER): Payer: 59

## 2021-03-10 ENCOUNTER — Other Ambulatory Visit: Payer: Self-pay

## 2021-03-10 ENCOUNTER — Other Ambulatory Visit (HOSPITAL_COMMUNITY)
Admission: RE | Admit: 2021-03-10 | Discharge: 2021-03-10 | Disposition: A | Discharge status: Home / Self Care | Payer: 59 | Source: Ambulatory Visit | Attending: Obstetrics & Gynecology | Admitting: Obstetrics & Gynecology

## 2021-03-10 DIAGNOSIS — Z113 Encounter for screening for infections with a predominantly sexual mode of transmission: Secondary | ICD-10-CM | POA: Insufficient documentation

## 2021-03-10 NOTE — Progress Notes (Signed)
° °  NURSE VISIT- STD  SUBJECTIVE:  Audrey Coleman is a 46 y.o. [id number] GYN patientfemale here for a vaginal swab for STD screen.  She reports the following symptoms: none. Denies abnormal vaginal bleeding, significant pelvic pain, fever, or UTI symptoms.  OBJECTIVE:  There were no vitals taken for this visit.  Appears well, in no apparent distress  ASSESSMENT: Vaginal swab for STD screen  PLAN: Self-collected vaginal probe for Gonorrhea, Chlamydia, Trichomonas sent to lab Pt requested labs: RPR, HIV, & Hep C to be done at [facility] Treatment: to be determined once results are received Follow-up as needed if symptoms persist/worsen, or new symptoms develop  Jacy Brocker A Naoki Migliaccio  03/10/2021 10:55 AM

## 2021-03-11 LAB — RPR: RPR Ser Ql: NONREACTIVE

## 2021-03-11 LAB — HIV ANTIBODY (ROUTINE TESTING W REFLEX): HIV Screen 4th Generation wRfx: NONREACTIVE

## 2021-03-11 LAB — CERVICOVAGINAL ANCILLARY ONLY
Chlamydia: NEGATIVE
Comment: NEGATIVE
Comment: NEGATIVE
Comment: NORMAL
Neisseria Gonorrhea: NEGATIVE
Trichomonas: NEGATIVE

## 2021-03-11 LAB — HEPATITIS C ANTIBODY: Hep C Virus Ab: NONREACTIVE

## 2021-10-04 IMAGING — MG MM DIGITAL SCREENING BILAT W/ TOMO AND CAD
8 series · 9 of 24 positions shown · non-contrast
Comparison: Previous exam(s).

CLINICAL DATA: Screening.

EXAM:
DIGITAL SCREENING BILATERAL MAMMOGRAM WITH TOMOSYNTHESIS AND CAD
TECHNIQUE: Bilateral screening digital craniocaudal and mediolateral oblique
mammograms were obtained. Bilateral screening digital breast
tomosynthesis was performed. The images were evaluated with
computer-aided detection.

[R MLO synth-2D]
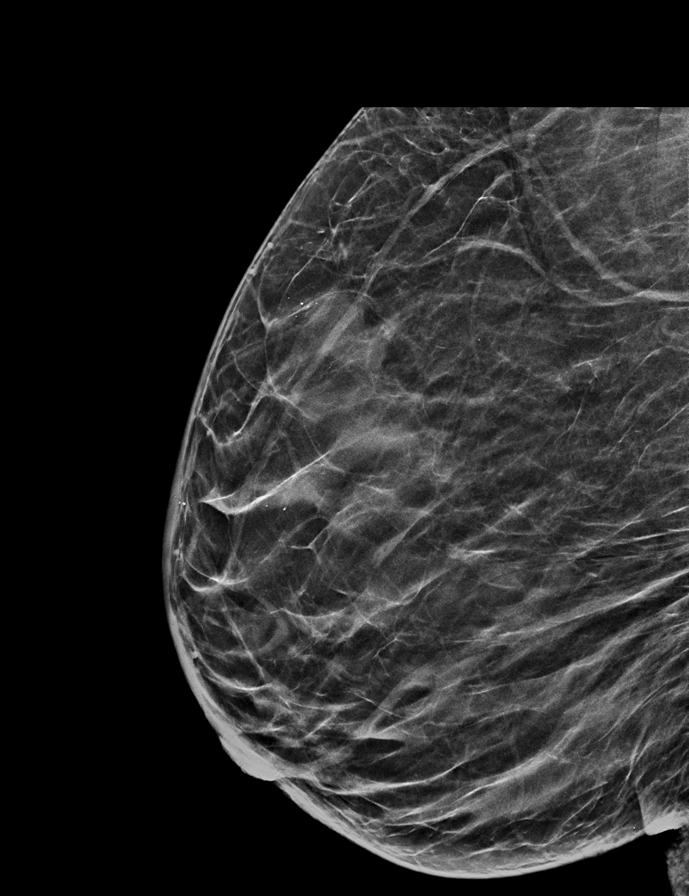

[L MLO synth-2D]
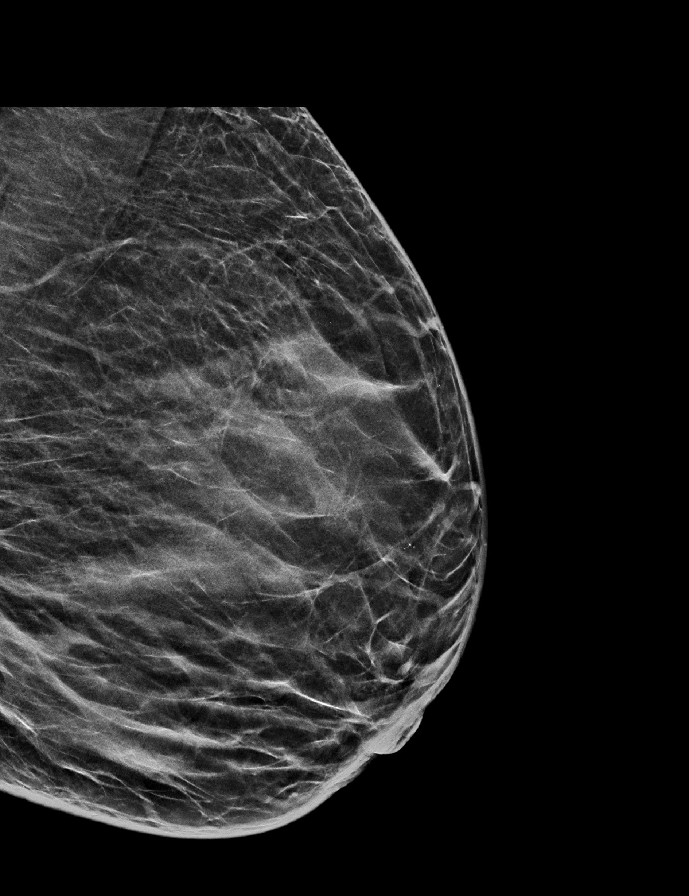

[L CC synth-2D]
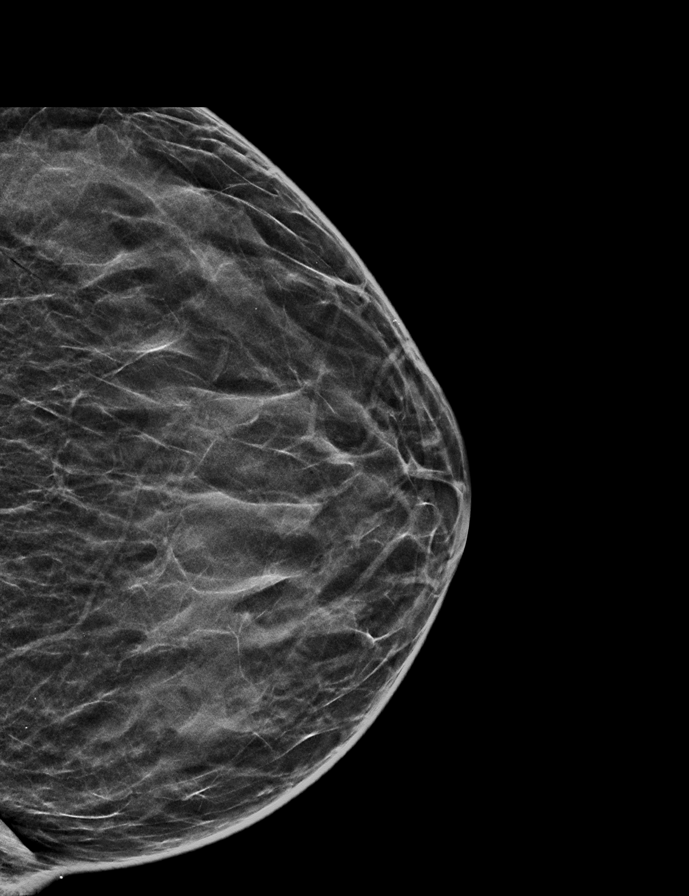

[R CC synth-2D]
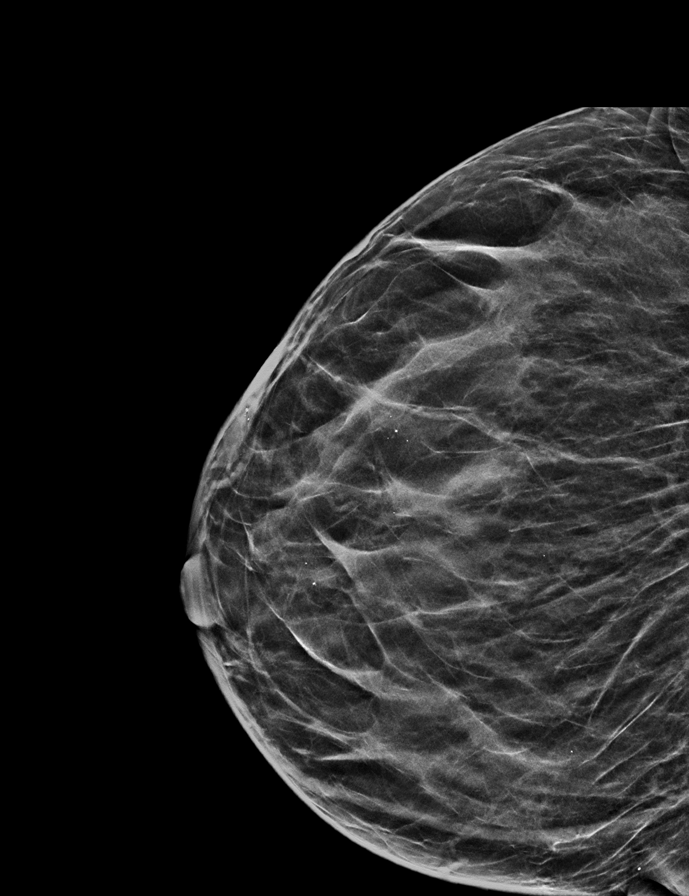

[L CC tomo · 2 of 38 frames shown]
[frame 13/38]
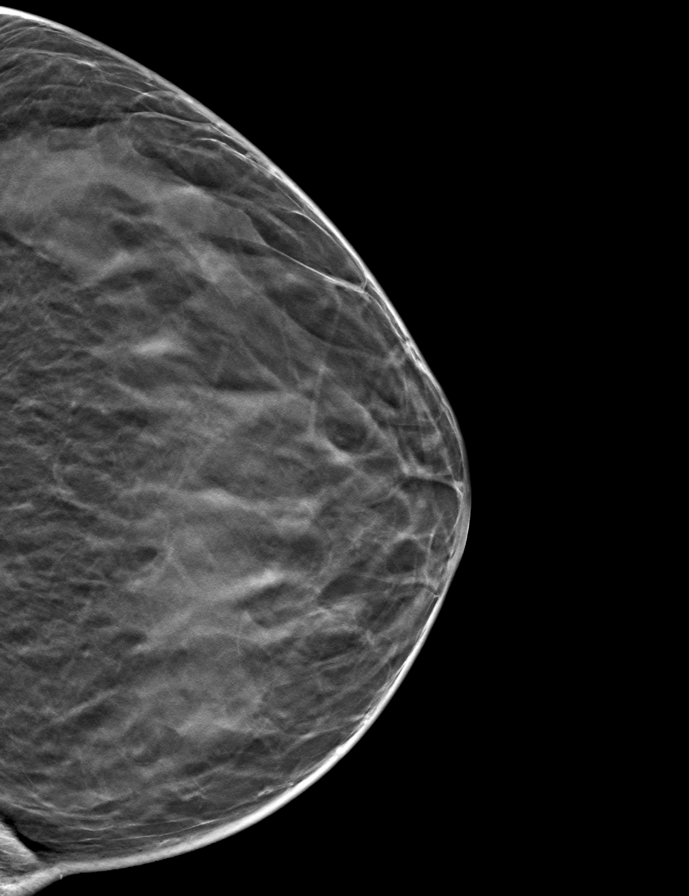
[frame 19/38]
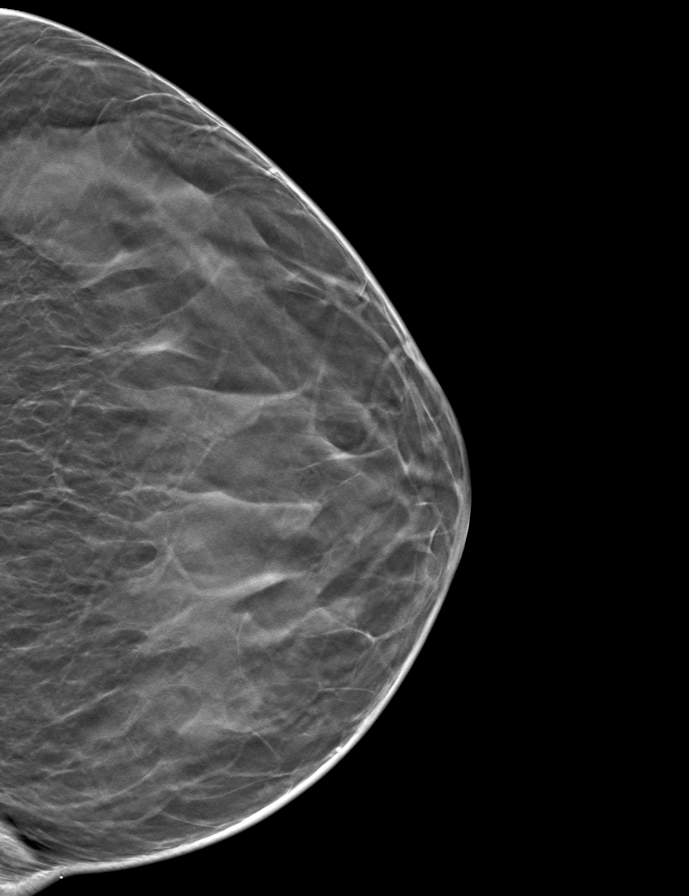

[L MLO tomo · tomo slice 21/40.0]
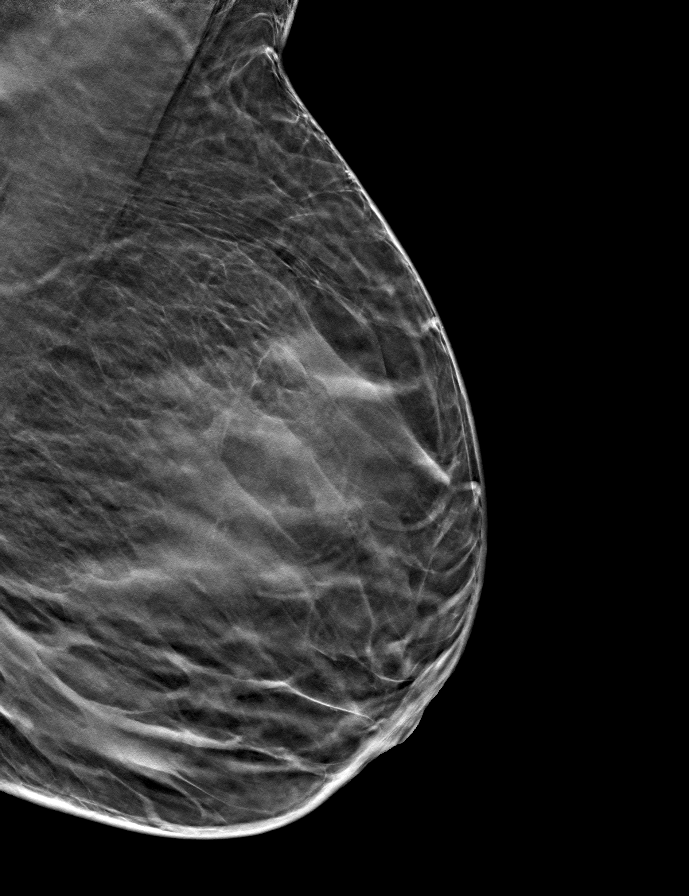

[R MLO tomo · tomo slice 23/44.0]
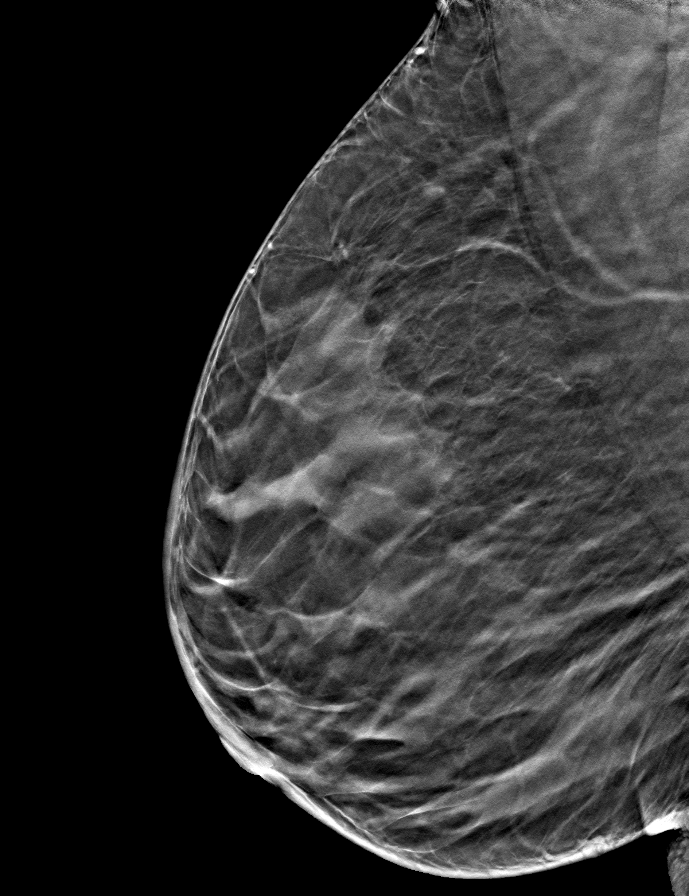

[R CC tomo · tomo slice 21/41.0]
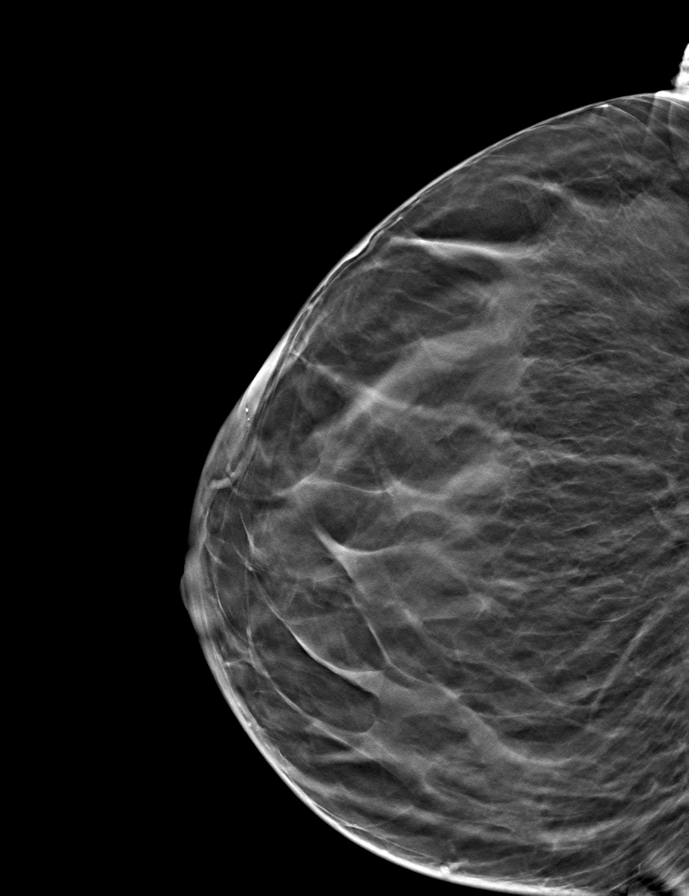

[9 of 24 positions shown; findings below may reference images not displayed]

ACR Breast Density Category c: The breast tissue is heterogeneously
dense, which may obscure small masses.
FINDINGS: There are no findings suspicious for malignancy. The images were
evaluated with computer-aided detection.
IMPRESSION: No mammographic evidence of malignancy. A result letter of this
screening mammogram will be mailed directly to the patient.

RECOMMENDATION:
Screening mammogram in one year. (Code:T4-5-GWO)

BI-RADS CATEGORY  1: Negative.

## 2021-10-28 ENCOUNTER — Encounter (HOSPITAL_COMMUNITY): Payer: Self-pay | Admitting: *Deleted

## 2022-03-19 ENCOUNTER — Other Ambulatory Visit (HOSPITAL_COMMUNITY)
Admission: RE | Admit: 2022-03-19 | Discharge: 2022-03-19 | Disposition: A | Discharge status: Home / Self Care | Payer: 59 | Source: Ambulatory Visit | Attending: Obstetrics & Gynecology | Admitting: Obstetrics & Gynecology

## 2022-03-19 ENCOUNTER — Other Ambulatory Visit (INDEPENDENT_AMBULATORY_CARE_PROVIDER_SITE_OTHER): Payer: 59

## 2022-03-19 DIAGNOSIS — Z113 Encounter for screening for infections with a predominantly sexual mode of transmission: Secondary | ICD-10-CM | POA: Diagnosis present

## 2022-03-19 NOTE — Progress Notes (Signed)
   NURSE VISIT- STD  SUBJECTIVE:  Audrey Coleman is a 47 y.o. [id number] GYN patientfemale here for a vaginal swab for STD screen.  States she likes to "check everything yearly". She reports the following symptoms: none but does not normally have symptoms when she has BV. Denies abnormal vaginal bleeding, significant pelvic pain, fever, or UTI symptoms.  OBJECTIVE:  There were no vitals taken for this visit.  Appears well, in no apparent distress  ASSESSMENT: Vaginal swab for STD screen  PLAN: Self-collected vaginal probe for Gonorrhea, Chlamydia, Trichomonas, Bacterial Vaginosis, Yeast sent to lab Treatment: to be determined once results are received Follow-up as needed if symptoms persist/worsen, or new symptoms develop  Alice Rieger  03/19/2022 12:34 PM

## 2022-03-22 ENCOUNTER — Other Ambulatory Visit: Payer: Self-pay | Admitting: Adult Health

## 2022-03-22 LAB — CERVICOVAGINAL ANCILLARY ONLY
Bacterial Vaginitis (gardnerella): POSITIVE — AB
Candida Glabrata: NEGATIVE
Candida Vaginitis: NEGATIVE
Chlamydia: NEGATIVE
Comment: NEGATIVE
Comment: NEGATIVE
Comment: NEGATIVE
Comment: NEGATIVE
Comment: NEGATIVE
Comment: NORMAL
Neisseria Gonorrhea: NEGATIVE
Trichomonas: NEGATIVE

## 2022-03-22 MED ORDER — METRONIDAZOLE 500 MG PO TABS: 500.0000 mg | ORAL_TABLET | Freq: Two times a day (BID) | ORAL | 0 refills | Status: DC

## 2022-03-22 NOTE — Progress Notes (Signed)
+  BV on vaginal swab, rx sent for flagyl, no sex or alcohol while taking meds

## 2022-03-24 ENCOUNTER — Encounter (INDEPENDENT_AMBULATORY_CARE_PROVIDER_SITE_OTHER): Payer: Self-pay

## 2022-03-25 ENCOUNTER — Other Ambulatory Visit (HOSPITAL_COMMUNITY)
Admission: RE | Admit: 2022-03-25 | Discharge: 2022-03-25 | Disposition: A | Discharge status: Home / Self Care | Payer: 59 | Source: Ambulatory Visit | Attending: Adult Health | Admitting: Adult Health

## 2022-03-25 ENCOUNTER — Encounter: Payer: Self-pay | Admitting: Adult Health

## 2022-03-25 ENCOUNTER — Ambulatory Visit (INDEPENDENT_AMBULATORY_CARE_PROVIDER_SITE_OTHER): Payer: 59 | Admitting: Adult Health

## 2022-03-25 VITALS — BP 137/92 | HR 65 | Ht 61.0 in | Wt 124.0 lb

## 2022-03-25 DIAGNOSIS — Z01419 Encounter for gynecological examination (general) (routine) without abnormal findings: Secondary | ICD-10-CM | POA: Diagnosis present

## 2022-03-25 DIAGNOSIS — Z1339 Encounter for screening examination for other mental health and behavioral disorders: Secondary | ICD-10-CM | POA: Diagnosis not present

## 2022-03-25 DIAGNOSIS — Z3202 Encounter for pregnancy test, result negative: Secondary | ICD-10-CM | POA: Diagnosis not present

## 2022-03-25 DIAGNOSIS — Z30013 Encounter for initial prescription of injectable contraceptive: Secondary | ICD-10-CM | POA: Diagnosis not present

## 2022-03-25 DIAGNOSIS — Z113 Encounter for screening for infections with a predominantly sexual mode of transmission: Secondary | ICD-10-CM

## 2022-03-25 DIAGNOSIS — Z1211 Encounter for screening for malignant neoplasm of colon: Secondary | ICD-10-CM | POA: Diagnosis not present

## 2022-03-25 DIAGNOSIS — Z1231 Encounter for screening mammogram for malignant neoplasm of breast: Secondary | ICD-10-CM | POA: Insufficient documentation

## 2022-03-25 LAB — HEMOCCULT GUIAC POC 1CARD (OFFICE): Fecal Occult Blood, POC: NEGATIVE

## 2022-03-25 LAB — POCT URINE PREGNANCY: Preg Test, Ur: NEGATIVE

## 2022-03-25 MED ORDER — MEDROXYPROGESTERONE ACETATE 150 MG/ML IM SUSP: 150.0000 mg | INTRAMUSCULAR | 4 refills | Status: DC

## 2022-03-25 MED ORDER — MEDROXYPROGESTERONE ACETATE 150 MG/ML IM SUSY
150.0000 mg | PREFILLED_SYRINGE | Freq: Once | INTRAMUSCULAR | Status: AC
  Administered 2022-03-25: 150 mg via INTRAMUSCULAR

## 2022-03-25 NOTE — Progress Notes (Signed)
Patient ID: [name], female   DOB: 01-07-1976, 47 y.o.   MRN: [id number] History of Present Illness: Audrey Coleman is a 47 year old black female,single, G3P003, in for a well woman gyn exam and pap. Pap was negative for malignancy and +HPV 05/22/20. She is sp gastric sleeve in 2021, and has lost over 100 lbs, and is on Wegovy. She wants to get back on depo. She requests STD labs.   PCP is Audrey Cheadle PA   Current Medications, Allergies, Past Medical History, Past Surgical History, Family History and Social History were reviewed in [employer] record.     Review of Systems: Patient denies any headaches, hearing loss, fatigue, blurred vision, shortness of breath, chest pain, abdominal pain, problems with bowel movements,(has constipation with [id number]), urination, or intercourse. No joint pain or mood swings.     Physical Exam:BP (!) 137/92 (BP Location: Right Arm, Patient Position: Sitting, Cuff Size: Normal)   Pulse 65   Ht '5\' 1"'$  (1.549 m)   Wt 124 lb (56.2 kg)   LMP 03/17/2022   BMI 23.43 kg/m  UPT is negative  General:  Well developed, well nourished, no acute distress Skin:  Warm and dry Neck:  Midline trachea, normal thyroid, good ROM, no lymphadenopathy Lungs; Clear to auscultation bilaterally Breast:  No dominant palpable mass, retraction, or nipple discharge Cardiovascular: Regular rate and rhythm Abdomen:  Soft, non tender, no hepatosplenomegaly Pelvic:  External genitalia is normal in appearance, no lesions.  The vagina is normal in appearance. Urethra has no lesions or masses. The cervix is bulbous,pap with HR HPV genotyping.  Uterus is felt to be normal size, shape, and contour.  No adnexal masses or tenderness noted.Bladder is non tender, no masses felt. Rectal: Good sphincter tone, no polyps, or hemorrhoids felt.  Hemoccult negative. Extremities/musculoskeletal:  No swelling or varicosities noted, no clubbing or cyanosis Psych:  No mood changes,  alert and cooperative,seems happy AA is 1 Fall risk is low    03/25/2022    9:19 AM 05/22/2020    9:16 AM 01/02/2020    9:25 AM  Depression screen PHQ 2/9  Decreased Interest 0 0 0  Down, Depressed, Hopeless 0 1 0  PHQ - 2 Score 0 1 0  Altered sleeping 1 1   Tired, decreased energy 0 1   Change in appetite 1 1   Feeling bad or failure about yourself  0 0   Trouble concentrating 0 0   Moving slowly or fidgety/restless 0 0   Suicidal thoughts 0 0   PHQ-9 Score 2 4        03/25/2022    9:19 AM 05/22/2020    9:16 AM  GAD 7 : Generalized Anxiety Score  Nervous, Anxious, on Edge 0 1  Control/stop worrying 0 0  Worry too much - different things 0 0  Trouble relaxing 1 0  Restless 0 0  Easily annoyed or irritable 0 0  Afraid - awful might happen 0 0  Total GAD 7 Score 1 1    Upstream - 03/25/22 [id number]       Pregnancy Intention Screening   Does the patient want to become pregnant in the next year? No    Does the patient's partner want to become pregnant in the next year? No    Would the patient like to discuss contraceptive options today? Yes      Contraception Wrap Up   Current Method No Method - Other Reason  End Method Hormonal Injection    Contraception Counseling Provided Yes    How was the end contraceptive method provided? Provided on site              Examination chaperoned by Levy Pupa LPN  Impression and plan: 1. Pregnancy examination or test, negative result - POCT urine pregnancy  2. Encounter for gynecological examination with Papanicolaou smear of cervix Pap sent Pap in 3 years if negative Physical in 1 year - Cytology - PAP( [place]) Cologuard or colonoscopy advised, gave handout on Cologuard   3. Encounter for screening fecal occult blood testing Hemoccult was negative   4. Encounter for initial prescription of injectable contraceptive She wants depo First injection in office today Meds ordered this encounter  Medications    medroxyPROGESTERone (DEPO-PROVERA) 150 MG/ML injection    Sig: Inject 1 mL (150 mg total) into the muscle every 3 (three) months.    Dispense:  1 mL    Refill:  4    Order Specific Question:   Supervising Provider    Answer:   Tania Ade H [[id number]]   Return in 12 week for depo Use condoms for 2 weeks at least   5. Screening examination for STD (sexually transmitted disease) Check labs at her request  - Hepatitis C antibody - Hepatitis B surface antigen - HIV Antibody (routine testing w rflx) - RPR  6. Screening mammogram for breast cancer Scheduled screening mammogram for her 03/29/22 at 9:30 am at [place]; Future

## 2022-03-25 NOTE — Addendum Note (Signed)
Addended by: Linton Rump on: 03/25/2022 10:18 AM   Modules accepted: Orders

## 2022-03-26 LAB — HIV ANTIBODY (ROUTINE TESTING W REFLEX): HIV Screen 4th Generation wRfx: NONREACTIVE

## 2022-03-26 LAB — HEPATITIS C ANTIBODY: Hep C Virus Ab: NONREACTIVE

## 2022-03-26 LAB — RPR: RPR Ser Ql: NONREACTIVE

## 2022-03-26 LAB — HEPATITIS B SURFACE ANTIGEN: Hepatitis B Surface Ag: NEGATIVE

## 2022-03-29 ENCOUNTER — Ambulatory Visit (HOSPITAL_COMMUNITY)
Admission: RE | Admit: 2022-03-29 | Discharge: 2022-03-29 | Disposition: A | Discharge status: Home / Self Care | Payer: 59 | Source: Ambulatory Visit | Attending: Adult Health | Admitting: Adult Health

## 2022-03-29 DIAGNOSIS — Z1231 Encounter for screening mammogram for malignant neoplasm of breast: Secondary | ICD-10-CM | POA: Diagnosis not present

## 2022-03-30 LAB — CYTOLOGY - PAP
Adequacy: ABSENT
Comment: NEGATIVE
Comment: NEGATIVE
Comment: NEGATIVE
HPV 16: NEGATIVE
HPV 18 / 45: NEGATIVE
High risk HPV: POSITIVE — AB

## 2022-04-01 ENCOUNTER — Encounter: Payer: Self-pay | Admitting: Adult Health

## 2022-04-01 ENCOUNTER — Telehealth: Payer: Self-pay | Admitting: Adult Health

## 2022-04-01 DIAGNOSIS — R87612 Low grade squamous intraepithelial lesion on cytologic smear of cervix (LGSIL): Secondary | ICD-10-CM | POA: Insufficient documentation

## 2022-04-01 NOTE — Telephone Encounter (Signed)
Left message to call the office for colposcopy appt to take closer look at cervix and possible biopsy as pap was +HPV and low grade lesion.

## 2022-04-12 ENCOUNTER — Encounter: Payer: Self-pay | Admitting: Women's Health

## 2022-04-12 ENCOUNTER — Other Ambulatory Visit (HOSPITAL_COMMUNITY)
Admission: RE | Admit: 2022-04-12 | Discharge: 2022-04-12 | Disposition: A | Discharge status: Home / Self Care | Payer: 59 | Source: Ambulatory Visit | Attending: Women's Health | Admitting: Women's Health

## 2022-04-12 ENCOUNTER — Ambulatory Visit (INDEPENDENT_AMBULATORY_CARE_PROVIDER_SITE_OTHER): Payer: 59 | Admitting: Women's Health

## 2022-04-12 VITALS — BP 133/88 | HR 82 | Ht 61.0 in | Wt 127.0 lb

## 2022-04-12 DIAGNOSIS — R87612 Low grade squamous intraepithelial lesion on cytologic smear of cervix (LGSIL): Secondary | ICD-10-CM

## 2022-04-12 DIAGNOSIS — Z3202 Encounter for pregnancy test, result negative: Secondary | ICD-10-CM

## 2022-04-12 LAB — POCT URINE PREGNANCY: Preg Test, Ur: NEGATIVE

## 2022-04-12 NOTE — Progress Notes (Signed)
   COLPOSCOPY PROCEDURE NOTE Patient name: Audrey Coleman MRN [id number]  Date of birth: 26-Apr-1975 Subjective Findings:   Audrey Coleman is a 47 y.o. G79P3003 African American female being seen today for a colposcopy. Indication: Abnormal pap on 03/25/22: LSIL w/ HRHPV positive: other (not 16, 18/45)  Prior cytology:  Date Result Procedure  05/22/20 NILM w/ HRHPV positive: other (not 16, 18/45) None  2017 NILM w/ HRHPV negative None          Patient's last menstrual period was 03/17/2022. Contraception: Depo-Provera injections. Menopausal: no. Hysterectomy: no.   Smoker: no. Immunocompromised: no.   The risks and benefits were explained and informed consent was obtained, and written copy is in chart. Pertinent History Reviewed:   Reviewed past medical,surgical, social, obstetrical and family history.  Reviewed problem list, medications and allergies. Objective Findings & Procedure:   Vitals:   04/12/22 1000  BP: 133/88  Pulse: 82  Weight: 127 lb (57.6 kg)  Height: 5\' 1"  (1.549 m)  Body mass index is 24 kg/m.  Results for orders placed or performed in visit on 04/12/22 (from the past 24 hour(s))  POCT urine pregnancy   Collection Time: 04/12/22 10:01 AM  Result Value Ref Range   Preg Test, Ur Negative Negative     Time out was performed.  Speculum placed in the vagina, cervix fully visualized. SCJ: fully visualized. Cervix swabbed x 3 with acetic acid.  Acetowhitening present: Yes Cervix: no visible lesions, no mosaicism, no punctation, no abnormal vasculature, and acetowhite lesion(s) noted at 3 & 9 o'clock. Cervical biopsies taken at 3 & 9 o'clock and Hemostasis achieved with Monsel's solution. Vagina: vaginal colposcopy not performed Vulva: vulvar colposcopy not performed  Specimens: 2  Complications: none  Chaperone: Celene Squibb    Colposcopic Impression & Plan:   Colposcopy findings consistent with LSIL Plan: Post biopsy instructions given, Will notify patient of  results when back, and Will base plan of care on pathology results and ASCCP guidelines  Return in about 1 year (around 04/12/2023) for Pap & physical.  Roma Schanz CNM, [facility] 04/12/2022 10:41 AM

## 2022-04-12 NOTE — Addendum Note (Signed)
Addended by: Janece Canterbury on: 04/12/2022 11:35 AM   Modules accepted: Orders

## 2022-04-12 NOTE — Patient Instructions (Signed)
Colposcopy, Care After  The following information offers guidance on how to care for yourself after your procedure. Your health care provider may also give you more specific instructions. If you have problems or questions, contact your health care provider. What can I expect after the procedure? If you had a colposcopy without a biopsy, you can expect to feel fine right away after your procedure. However, you may have some spotting of blood for a few days. You can return to your normal activities. If you had a colposcopy with a biopsy, it is common after the procedure to have: Soreness and mild pain. These may last for a few days. Mild vaginal bleeding or discharge that is dark-colored and grainy. This may last for a few days. The discharge may be caused by a liquid (solution) that was used during the procedure. You may need to wear a sanitary pad during this time. Spotting of blood for at least 48 hours after the procedure. Follow these instructions at home: Medicines Take over-the-counter and prescription medicines only as told by your health care provider. Talk with your health care provider about what type of over-the-counter pain medicines and prescription medicines you can start to take again. It is especially important to talk with your health care provider if you take blood thinners. Activity Avoid using douche products, using tampons, and having sex for at least 3 days after the procedure or for as long as told by your health care provider. Return to your normal activities as told by your health care provider. Ask your health care provider what activities are safe for you. General instructions Ask your health care provider if you may take baths, swim, or use a hot tub. You may take showers. If you use birth control (contraception), continue to use it. Keep all follow-up visits. This is important. Contact a health care provider if: You have a fever or chills. You faint or feel  light-headed. Get help right away if: You have heavy bleeding from your vagina or pass blood clots. Heavy bleeding is bleeding that soaks through a sanitary pad in less than 1 hour. You have vaginal discharge that is abnormal, is yellow in color, or smells bad. This could be a sign of infection. You have severe pain or cramps in your lower abdomen that do not go away with medicine. Summary If you had a colposcopy without a biopsy, you can expect to feel fine right away, but you may have some spotting of blood for a few days. You can return to your normal activities. If you had a colposcopy with a biopsy, it is common to have mild pain for a few days and spotting for 48 hours after the procedure. Avoid using douche products, using tampons, and having sex for at least 3 days after the procedure or for as long as told by your health care provider. Get help right away if you have heavy bleeding, severe pain, or signs of infection. This information is not intended to replace advice given to you by your health care provider. Make sure you discuss any questions you have with your health care provider. Document Revised: 06/01/2020 Document Reviewed: 06/01/2020 Elsevier Patient Education  2023 Elsevier Inc.  

## 2022-04-14 LAB — SURGICAL PATHOLOGY

## 2022-05-14 ENCOUNTER — Other Ambulatory Visit: Payer: Self-pay | Admitting: Adult Health

## 2022-05-14 MED ORDER — MEGESTROL ACETATE 40 MG PO TABS: ORAL_TABLET | ORAL | 1 refills | Status: DC

## 2022-05-14 NOTE — Progress Notes (Signed)
Will rx megace to stop bleeding on depo

## 2022-06-16 ENCOUNTER — Ambulatory Visit (INDEPENDENT_AMBULATORY_CARE_PROVIDER_SITE_OTHER): Payer: 59 | Admitting: *Deleted

## 2022-06-16 DIAGNOSIS — Z3042 Encounter for surveillance of injectable contraceptive: Secondary | ICD-10-CM | POA: Diagnosis not present

## 2022-06-16 MED ORDER — MEDROXYPROGESTERONE ACETATE 150 MG/ML IM SUSP
150.0000 mg | Freq: Once | INTRAMUSCULAR | Status: AC
  Administered 2022-06-16: 150 mg via INTRAMUSCULAR

## 2022-06-16 NOTE — Progress Notes (Signed)
   NURSE VISIT- INJECTION  SUBJECTIVE:  Audrey Coleman is a 47 y.o. G23P3003 female here for a Depo Provera for contraception/period management. She is a GYN patient.   OBJECTIVE:  There were no vitals taken for this visit.  Appears well, in no apparent distress  Injection administered in: Left deltoid  Meds ordered this encounter  Medications   medroxyPROGESTERone (DEPO-PROVERA) injection 150 mg    ASSESSMENT: GYN patient Depo Provera for contraception/period management PLAN: Follow-up: in 11-13 weeks for next Depo   Jobe Marker  06/16/2022 9:47 AM

## 2022-09-08 ENCOUNTER — Ambulatory Visit: Payer: 59

## 2022-09-13 ENCOUNTER — Ambulatory Visit (INDEPENDENT_AMBULATORY_CARE_PROVIDER_SITE_OTHER): Payer: 59 | Admitting: *Deleted

## 2022-09-13 DIAGNOSIS — Z3042 Encounter for surveillance of injectable contraceptive: Secondary | ICD-10-CM | POA: Diagnosis not present

## 2022-09-13 MED ORDER — MEDROXYPROGESTERONE ACETATE 150 MG/ML IM SUSP
150.0000 mg | Freq: Once | INTRAMUSCULAR | Status: AC
  Administered 2022-09-13: 150 mg via INTRAMUSCULAR

## 2022-09-13 NOTE — Progress Notes (Signed)
   NURSE VISIT- INJECTION  SUBJECTIVE:  Audrey Coleman is a 47 y.o. G66P3003 female here for a Depo Provera for contraception/period management. She is a GYN patient.   OBJECTIVE:  There were no vitals taken for this visit.  Appears well, in no apparent distress  Injection administered in: Right deltoid  Meds ordered this encounter  Medications   medroxyPROGESTERone (DEPO-PROVERA) injection 150 mg    ASSESSMENT: GYN patient Depo Provera for contraception/period management PLAN: Follow-up: in 11-13 weeks for next Depo   Jobe Marker  09/13/2022 4:32 PM

## 2022-11-03 ENCOUNTER — Encounter (HOSPITAL_COMMUNITY): Payer: Self-pay | Admitting: *Deleted

## 2022-12-06 ENCOUNTER — Ambulatory Visit (INDEPENDENT_AMBULATORY_CARE_PROVIDER_SITE_OTHER): Payer: 59 | Admitting: *Deleted

## 2022-12-06 DIAGNOSIS — Z3042 Encounter for surveillance of injectable contraceptive: Secondary | ICD-10-CM | POA: Diagnosis not present

## 2022-12-06 MED ORDER — MEDROXYPROGESTERONE ACETATE 150 MG/ML IM SUSY
150.0000 mg | PREFILLED_SYRINGE | Freq: Once | INTRAMUSCULAR | Status: AC
  Administered 2022-12-06: 150 mg via INTRAMUSCULAR

## 2022-12-06 NOTE — Progress Notes (Unsigned)
   NURSE VISIT- INJECTION  SUBJECTIVE:  Audrey Coleman is a 47 y.o. G6P3003 female here for a Depo Provera for contraception/period management. She is a GYN patient.   OBJECTIVE:  There were no vitals taken for this visit.  Appears well, in no apparent distress  Patient brought in Depo Provera [facility] [id number] Lot # [id number] Exp 02/2024) from her pharmacy. Attempted to draw up medication via 22g needle,however, medication would not pull into vial.  Needle changed. Second attempt made to pull medication into vial with no success.   Needle changed to 18g and medication pulled into vial. Needle changed to 22g to administer in deltoid, however, when attempting to inject medication in patient's deltoid, medication would not inject. Depo discarded and floor stock given to patient.    Injection administered in: Right deltoid  Meds ordered this encounter  Medications   medroxyPROGESTERone Acetate SUSY 150 mg    ASSESSMENT: GYN patient Depo Provera for contraception/period management PLAN: Follow-up: in 11-13 weeks for next Depo   Jobe Marker  12/07/2022 4:09 PM

## 2023-02-07 ENCOUNTER — Other Ambulatory Visit: Payer: Self-pay | Admitting: Adult Health

## 2023-02-07 MED ORDER — MEGESTROL ACETATE 40 MG PO TABS: ORAL_TABLET | ORAL | 1 refills | Status: AC

## 2023-02-07 NOTE — Progress Notes (Signed)
Rx megace??  

## 2023-02-18 ENCOUNTER — Encounter (HOSPITAL_COMMUNITY): Payer: Self-pay

## 2023-02-18 ENCOUNTER — Emergency Department (HOSPITAL_COMMUNITY): Payer: 59

## 2023-02-18 ENCOUNTER — Emergency Department (HOSPITAL_COMMUNITY)
Admission: EM | Admit: 2023-02-18 | Discharge: 2023-02-18 | Disposition: A | Discharge status: Home / Self Care | Payer: 59 | Attending: Emergency Medicine | Admitting: Emergency Medicine

## 2023-02-18 ENCOUNTER — Other Ambulatory Visit: Payer: Self-pay

## 2023-02-18 DIAGNOSIS — R109 Unspecified abdominal pain: Secondary | ICD-10-CM | POA: Insufficient documentation

## 2023-02-18 DIAGNOSIS — M549 Dorsalgia, unspecified: Secondary | ICD-10-CM | POA: Diagnosis not present

## 2023-02-18 DIAGNOSIS — I10 Essential (primary) hypertension: Secondary | ICD-10-CM | POA: Insufficient documentation

## 2023-02-18 LAB — CBC WITH DIFFERENTIAL/PLATELET
Abs Immature Granulocytes: 0.02 10*3/uL (ref 0.00–0.07)
Basophils Absolute: 0 10*3/uL (ref 0.0–0.1)
Basophils Relative: 1 %
Eosinophils Absolute: 0.1 10*3/uL (ref 0.0–0.5)
Eosinophils Relative: 2 %
HCT: 38 % (ref 36.0–46.0)
Hemoglobin: 12.2 g/dL (ref 12.0–15.0)
Immature Granulocytes: 0 %
Lymphocytes Relative: 28 %
Lymphs Abs: 2 10*3/uL (ref 0.7–4.0)
MCH: 26.9 pg (ref 26.0–34.0)
MCHC: 32.1 g/dL (ref 30.0–36.0)
MCV: 83.7 fL (ref 80.0–100.0)
Monocytes Absolute: 0.5 10*3/uL (ref 0.1–1.0)
Monocytes Relative: 6 %
Neutro Abs: 4.7 10*3/uL (ref 1.7–7.7)
Neutrophils Relative %: 63 %
Platelets: 316 10*3/uL (ref 150–400)
RBC: 4.54 MIL/uL (ref 3.87–5.11)
RDW: 15.1 % (ref 11.5–15.5)
WBC: 7.4 10*3/uL (ref 4.0–10.5)
nRBC: 0 % (ref 0.0–0.2)

## 2023-02-18 LAB — COMPREHENSIVE METABOLIC PANEL
ALT: 7 U/L (ref 0–44)
AST: 16 U/L (ref 15–41)
Albumin: 4 g/dL (ref 3.5–5.0)
Alkaline Phosphatase: 29 U/L — ABNORMAL LOW (ref 38–126)
Anion gap: 10 (ref 5–15)
BUN: 6 mg/dL (ref 6–20)
CO2: 22 mmol/L (ref 22–32)
Calcium: 9.5 mg/dL (ref 8.9–10.3)
Chloride: 106 mmol/L (ref 98–111)
Creatinine, Ser: 0.87 mg/dL (ref 0.44–1.00)
GFR, Estimated: >60 mL/min (ref >60)
Glucose, Bld: 92 mg/dL (ref 70–99)
Potassium: 4 mmol/L (ref 3.5–5.1)
Sodium: 138 mmol/L (ref 135–145)
Total Bilirubin: 0.8 mg/dL (ref 0.0–1.2)
Total Protein: 7.2 g/dL (ref 6.5–8.1)

## 2023-02-18 MED ORDER — OXYCODONE-ACETAMINOPHEN 5-325 MG PO TABS: 1.0000 | ORAL_TABLET | Freq: Four times a day (QID) | ORAL | 0 refills | Status: DC | PRN

## 2023-02-18 MED ORDER — HYDROMORPHONE HCL 1 MG/ML IJ SOLN
0.5000 mg | Freq: Once | INTRAMUSCULAR | Status: AC
  Administered 2023-02-18: 0.5 mg via INTRAVENOUS
  Filled 2023-02-18: qty 0.5

## 2023-02-18 MED ORDER — PREDNISONE 10 MG PO TABS: 20.0000 mg | ORAL_TABLET | Freq: Every day | ORAL | 0 refills | Status: DC

## 2023-02-18 MED ORDER — IOHEXOL 300 MG/ML  SOLN
100.0000 mL | Freq: Once | INTRAMUSCULAR | Status: AC | PRN
  Administered 2023-02-18: 100 mL via INTRAVENOUS

## 2023-02-18 NOTE — ED Provider Notes (Signed)
[place] EMERGENCY DEPARTMENT AT [facility] Provider Note   CSN: [id number] Arrival date & time: 02/18/23  1504     History {Add pertinent medical, surgical, social history, OB history to HPI:1} Chief Complaint  Patient presents with   Flank Pain    Audrey Coleman is a 48 y.o. female.  Patient complains of back pain worse on the left.  Patient has a history of hypertension   Flank Pain       Home Medications Prior to Admission medications   Medication Sig Start Date End Date Taking? Authorizing Provider  oxyCODONE-acetaminophen (PERCOCET/ROXICET) 5-325 MG tablet Take 1 tablet by mouth every 6 (six) hours as needed for severe pain (pain score 7-10). 02/18/23  Yes Bethann Berkshire, MD  predniSONE (DELTASONE) 10 MG tablet Take 2 tablets (20 mg total) by mouth daily. 02/18/23  Yes Bethann Berkshire, MD  ZEPBOUND 12.5 MG/0.5ML Pen Inject 12.5 mg into the skin once a week. 02/03/23  Yes [provider]  medroxyPROGESTERone (DEPO-PROVERA) 150 MG/ML injection Inject 1 mL (150 mg total) into the muscle every 3 (three) months. 03/25/22   Adline Potter, NP  megestrol (MEGACE) 40 MG tablet Take 3 x 5 days then 2 x 5 days then 1 daily 02/07/23   Adline Potter, NP  metroNIDAZOLE (FLAGYL) 500 MG tablet Take 1 tablet (500 mg total) by mouth 2 (two) times daily. Patient not taking: Reported on 04/12/2022 03/22/22   Adline Potter, NP  WEGOVY 1.7 MG/0.75ML SOAJ SMARTSIG:1.7 Milligram(s) SUB-Q Once a Week 03/10/22   [provider]      Allergies    Patient has no known allergies.    Review of Systems   Review of Systems  Genitourinary:  Positive for flank pain.    Physical Exam Updated Vital Signs BP 129/89   Pulse 81   Temp 99 F (37.2 C) (Oral)   Resp 17   Ht 5\' 1"  (1.549 m)   Wt 57.5 kg   LMP 02/11/2023 (Exact Date)   SpO2 98%   BMI 23.95 kg/m  Physical Exam  ED Results / Procedures / Treatments   Labs (all labs ordered are listed, but  only abnormal results are displayed) Labs Reviewed  COMPREHENSIVE METABOLIC PANEL - Abnormal; Notable for the following components:      Result Value   Alkaline Phosphatase 29 (*)    All other components within normal limits  CBC WITH DIFFERENTIAL/PLATELET    EKG None  Radiology CT ABDOMEN PELVIS W CONTRAST Result Date: 02/18/2023 CLINICAL DATA:  Left flank pain EXAM: CT ABDOMEN AND PELVIS WITH CONTRAST TECHNIQUE: Multidetector CT imaging of the abdomen and pelvis was performed using the standard protocol following bolus administration of intravenous contrast. RADIATION DOSE REDUCTION: This exam was performed according to the departmental dose-optimization program which includes automated exposure control, adjustment of the mA and/or kV according to patient size and/or use of iterative reconstruction technique. CONTRAST:  OMNIPAQUE IOHEXOL 300 MG/ML  SOLN COMPARISON:  None Available. FINDINGS: Lower chest: Unremarkable Hepatobiliary: Subcentimeter hypodense hepatic lesions are technically nonspecific although statistically highly likely to be cysts. No further imaging workup of these lesions is indicated. Pancreas: Unremarkable Spleen: Unremarkable Adrenals/Urinary Tract: Sub-centimeter right renal lesions are likely cysts. No further imaging workup of these lesions is indicated. Adrenal glands unremarkable. Urinary bladder appears normal. Punctate calcifications in the lower pelvis are difficult to definitively separate from the distal ureters, but there is no hydronephrosis or hydroureter to further suggest presence of  a significant ureteral calculus. Stomach/Bowel: Postoperative findings from sleeve gastric resection. Redundant sigmoid and transverse colon. Vascular/Lymphatic: Unremarkable Reproductive: Unremarkable Other: Postoperative findings from prior abdominoplasty. Musculoskeletal: Left greater than right foraminal impingement at L4-5 due to disc bulge and facet arthropathy. There is  likely left foraminal impingement at L3-4 as well. IMPRESSION: 1. No specific abnormality is identified to explain the patient's acute left flank pain. 2. Left greater than right foraminal impingement at L4-5 due to disc bulge and facet arthropathy. There is likely left foraminal impingement at L3-4 as well. Electronically Signed   By: Gaylyn Rong M.D.   On: 02/18/2023 18:40    Procedures Procedures  {Document cardiac monitor, telemetry assessment procedure when appropriate:1}  Medications Ordered in ED Medications  HYDROmorphone (DILAUDID) injection 0.5 mg (0.5 mg Intravenous Given 02/18/23 1730)  iohexol (OMNIPAQUE) 300 MG/ML solution 100 mL (100 mLs Intravenous Contrast Given 02/18/23 1750)    ED Course/ Medical Decision Making/ A&P   {   Click here for ABCD2, HEART and other calculatorsREFRESH Note before signing :1}                              Medical Decision Making Amount and/or Complexity of Data Reviewed Labs: ordered. Radiology: ordered.  Risk Prescription drug management.   Patient with back pain is L4-5 disc protrusion.  She is sent home on prednisone and Percocet and will follow-up with PCP  {Document critical care time when appropriate:1} {Document review of labs and clinical decision tools ie heart score, Chads2Vasc2 etc:1}  {Document your independent review of radiology images, and any outside records:1} {Document your discussion with family members, caretakers, and with consultants:1} {Document social determinants of health affecting pt's care:1} {Document your decision making why or why not admission, treatments were needed:1} Final Clinical Impression(s) / ED Diagnoses Final diagnoses:  Flank pain    Rx / DC Orders ED Discharge Orders          Ordered    predniSONE (DELTASONE) 10 MG tablet  Daily        02/18/23 2000    oxyCODONE-acetaminophen (PERCOCET/ROXICET) 5-325 MG tablet  Every 6 hours PRN        02/18/23 2000

## 2023-02-18 NOTE — ED Triage Notes (Signed)
Pt arrived via POV c/o left flank pain. Pt reports pain is localized, non-radiating, and Pt reports pain is worse with movement. Pt denies N/V/D, denies injury.

## 2023-02-18 NOTE — Discharge Instructions (Signed)
Follow-up with your doctor next week for recheck 

## 2023-02-24 ENCOUNTER — Ambulatory Visit: Payer: 59 | Admitting: *Deleted

## 2023-02-24 DIAGNOSIS — Z3042 Encounter for surveillance of injectable contraceptive: Secondary | ICD-10-CM

## 2023-02-24 MED ORDER — MEDROXYPROGESTERONE ACETATE 150 MG/ML IM SUSP
150.0000 mg | Freq: Once | INTRAMUSCULAR | Status: AC
  Administered 2023-02-24: 150 mg via INTRAMUSCULAR

## 2023-02-24 NOTE — Progress Notes (Signed)
   NURSE VISIT- INJECTION  SUBJECTIVE:  Audrey Coleman is a 48 y.o. G4P3003 female here for a Depo Provera  for contraception/period management. She is a GYN patient.   OBJECTIVE:  LMP 02/11/2023 (Exact Date)   Appears well, in no apparent distress  Injection administered in: Left deltoid  Meds ordered this encounter  Medications   medroxyPROGESTERone  (DEPO-PROVERA ) injection 150 mg    ASSESSMENT: GYN patient Depo Provera  for contraception/period management PLAN: Follow-up: in 11-13 weeks for next Depo   Rutherford Rover  02/24/2023 4:03 PM

## 2023-05-18 ENCOUNTER — Other Ambulatory Visit: Payer: Self-pay | Admitting: Adult Health

## 2023-05-19 ENCOUNTER — Ambulatory Visit: Payer: 59

## 2023-05-24 ENCOUNTER — Ambulatory Visit: Admitting: *Deleted

## 2023-05-24 DIAGNOSIS — Z3042 Encounter for surveillance of injectable contraceptive: Secondary | ICD-10-CM | POA: Diagnosis not present

## 2023-05-24 MED ORDER — MEDROXYPROGESTERONE ACETATE 150 MG/ML IM SUSP
150.0000 mg | Freq: Once | INTRAMUSCULAR | Status: AC
  Administered 2023-05-24: 150 mg via INTRAMUSCULAR

## 2023-05-24 NOTE — Progress Notes (Signed)
   NURSE VISIT- INJECTION  SUBJECTIVE:  Audrey Coleman is a 48 y.o. G24P3003 female here for a Depo Provera  for contraception/period management. She is a GYN patient.   OBJECTIVE:  There were no vitals taken for this visit.  Appears well, in no apparent distress  Injection administered in: Right deltoid  Meds ordered this encounter  Medications   medroxyPROGESTERone  (DEPO-PROVERA ) injection 150 mg    ASSESSMENT: GYN patient Depo Provera  for contraception/period management PLAN: Follow-up: in 11-13 weeks for next Depo   Laverne Potter  05/24/2023 3:53 PM

## 2023-08-16 ENCOUNTER — Encounter: Payer: Self-pay | Admitting: Adult Health

## 2023-08-16 ENCOUNTER — Ambulatory Visit: Admitting: Adult Health

## 2023-08-16 ENCOUNTER — Other Ambulatory Visit (HOSPITAL_COMMUNITY)
Admission: RE | Admit: 2023-08-16 | Discharge: 2023-08-16 | Disposition: A | Discharge status: Home / Self Care | Source: Ambulatory Visit | Attending: Adult Health | Admitting: Adult Health

## 2023-08-16 VITALS — BP 129/87 | HR 84 | Ht 61.0 in | Wt 144.0 lb

## 2023-08-16 DIAGNOSIS — R87612 Low grade squamous intraepithelial lesion on cytologic smear of cervix (LGSIL): Secondary | ICD-10-CM

## 2023-08-16 DIAGNOSIS — Z1331 Encounter for screening for depression: Secondary | ICD-10-CM | POA: Diagnosis not present

## 2023-08-16 DIAGNOSIS — Z3042 Encounter for surveillance of injectable contraceptive: Secondary | ICD-10-CM

## 2023-08-16 DIAGNOSIS — Z1212 Encounter for screening for malignant neoplasm of rectum: Secondary | ICD-10-CM

## 2023-08-16 DIAGNOSIS — Z01419 Encounter for gynecological examination (general) (routine) without abnormal findings: Secondary | ICD-10-CM | POA: Diagnosis present

## 2023-08-16 DIAGNOSIS — Z1211 Encounter for screening for malignant neoplasm of colon: Secondary | ICD-10-CM

## 2023-08-16 DIAGNOSIS — I10 Essential (primary) hypertension: Secondary | ICD-10-CM

## 2023-08-16 LAB — HEMOCCULT GUIAC POC 1CARD (OFFICE): Fecal Occult Blood, POC: NEGATIVE

## 2023-08-16 MED ORDER — MEDROXYPROGESTERONE ACETATE 150 MG/ML IM SUSY
150.0000 mg | PREFILLED_SYRINGE | Freq: Once | INTRAMUSCULAR | Status: AC
  Administered 2023-08-16: 150 mg via INTRAMUSCULAR

## 2023-08-16 NOTE — Addendum Note (Signed)
 Addended by: NEYSA CLARITA RAMAN on: 08/16/2023 04:43 PM   Modules accepted: Orders

## 2023-08-16 NOTE — Progress Notes (Signed)
 Patient ID: Audrey Coleman, female   DOB: Apr 19, 1975, 48 y.o.   MRN: [id number] History of Present Illness: Audrey Coleman is a 48 year old black female, single, [id number] in for a well woman gyn exam and pap and time for depo. Last pap was LSIL +HPV 03/25/22 with colpo 04/12/23 low grade cells.  PCP is Audrey Mace PA   Current Medications, Allergies, Past Medical History, Past Surgical History, Family History and Social History were reviewed in [employer] record.     Review of Systems: Patient denies any headaches, hearing loss, fatigue, blurred vision, shortness of breath, chest pain, abdominal pain, problems with bowel movements, urination, or intercourse. No joint pain or mood swings.  Spots with depo   Physical Exam:BP 129/87 (BP Location: Left Arm, Patient Position: Sitting, Cuff Size: Normal)   Pulse 84   Ht 5' 1 (1.549 m)   Wt 144 lb (65.3 kg)   BMI 27.21 kg/m   General:  Well developed, well nourished, no acute distress Skin:  Warm and dry Neck:  Midline trachea, normal thyroid , good ROM, no lymphadenopathy Lungs; Clear to auscultation bilaterally Breast:  No dominant palpable mass, retraction, or nipple discharge Cardiovascular: Regular rate and rhythm Abdomen:  Soft, non tender, no hepatosplenomegaly Pelvic:  External genitalia is normal in appearance, no lesions.  The vagina is normal in appearance, tan discharge. Urethra has no lesions or masses. The cervix is bulbous,pap with GC/CHL,trich and HR HPV genotyping performed.  Uterus is felt to be normal size, shape, and contour.  No adnexal masses or tenderness noted.Bladder is non tender, no masses felt. Rectal: Good sphincter tone, no polyps, or hemorrhoids felt.  Hemoccult negative. Extremities/musculoskeletal:  No swelling or varicosities noted, no clubbing or cyanosis Psych:  No mood changes, alert and cooperative,seems happy AA is 1 Fall risk is low    08/16/2023    3:56 PM 03/25/2022    9:19 AM  05/22/2020    9:16 AM  Depression screen PHQ 2/9  Decreased Interest 0 0 0  Down, Depressed, Hopeless 0 0 1  PHQ - 2 Score 0 0 1  Altered sleeping 1 1 1   Tired, decreased energy 1 0 1  Change in appetite 0 1 1  Feeling bad or failure about yourself  0 0 0  Trouble concentrating 0 0 0  Moving slowly or fidgety/restless 0 0 0  Suicidal thoughts 0 0 0  PHQ-9 Score 2 2 4        08/16/2023    3:56 PM 03/25/2022    9:19 AM 05/22/2020    9:16 AM  GAD 7 : Generalized Anxiety Score  Nervous, Anxious, on Edge 0 0 1  Control/stop worrying 1 0 0  Worry too much - different things 1 0 0  Trouble relaxing 0 1 0  Restless 0 0 0  Easily annoyed or irritable 0 0 0  Afraid - awful might happen 0 0 0  Total GAD 7 Score 2 1 1       Upstream - 08/16/23 1547       Pregnancy Intention Screening   Does the patient want to become pregnant in the next year? No    Does the patient's partner want to become pregnant in the next year? No    Would the patient like to discuss contraceptive options today? No      Contraception Wrap Up   Current Method Hormonal Injection    End Method Hormonal Injection    Contraception Counseling Provided Yes  Examination chaperoned by Clarita Salt LPN  Impression and plan: 1. Encounter for gynecological examination with Papanicolaou smear of cervix (Primary) Pap sent Physical in 1 year Mammogram was negative 03/29/22, get one this year  - Cytology - PAP( [place])  2. Encounter for screening fecal occult blood testing Hemoccult was negative   3. LGSIL on Pap smear of cervix Pap sent   4. Depo-Provera  contraceptive status Depo given IM by Slater Salt LPN   5. Screening for colorectal cancer Refer to [facility] for colonoscopy  - Ambulatory referral to Gastroenterology  6. Hypertension, unspecified type Has been off meds since gastric by pass Keep check on BP and follow up with PCP

## 2023-08-17 ENCOUNTER — Encounter (INDEPENDENT_AMBULATORY_CARE_PROVIDER_SITE_OTHER): Payer: Self-pay | Admitting: *Deleted

## 2023-08-23 LAB — CYTOLOGY - PAP
Chlamydia: NEGATIVE
Comment: NEGATIVE
Comment: NEGATIVE
Comment: NEGATIVE
Comment: NEGATIVE
Comment: NEGATIVE
Comment: NORMAL
Diagnosis: UNDETERMINED — AB
HPV 16: NEGATIVE
HPV 18 / 45: NEGATIVE
High risk HPV: POSITIVE — AB
Neisseria Gonorrhea: NEGATIVE
Trichomonas: NEGATIVE

## 2023-08-26 ENCOUNTER — Ambulatory Visit: Payer: Self-pay | Admitting: Adult Health

## 2023-08-26 DIAGNOSIS — R87610 Atypical squamous cells of undetermined significance on cytologic smear of cervix (ASC-US): Secondary | ICD-10-CM | POA: Insufficient documentation

## 2023-09-20 ENCOUNTER — Ambulatory Visit: Admitting: Obstetrics & Gynecology

## 2023-09-20 VITALS — BP 129/83 | HR 82 | Ht 61.0 in

## 2023-09-20 DIAGNOSIS — Z3202 Encounter for pregnancy test, result negative: Secondary | ICD-10-CM | POA: Diagnosis not present

## 2023-09-20 DIAGNOSIS — R87610 Atypical squamous cells of undetermined significance on cytologic smear of cervix (ASC-US): Secondary | ICD-10-CM | POA: Diagnosis not present

## 2023-09-20 LAB — POCT URINE PREGNANCY: Preg Test, Ur: NEGATIVE

## 2023-09-20 NOTE — Progress Notes (Signed)
    Colposcopy Procedure Note:    Colposcopy Procedure Note  Indications:  2024 LSIL + HPV(neg 16/18)-->colpo + biopsy=LSIL 2025 ASCUS + HPV(neg 16/18)   2019 ASCCP recommendation:  Smoker:  No. New sexual partner:  No.    History of abnormal Pap: yes  Procedure Details  The risks and benefits of the procedure and Written informed consent obtained.  Speculum placed in vagina and excellent visualization of cervix achieved, cervix swabbed x 3 with acetic acid solution.  Findings: Adequate colposcopy is noted today.  Cervix: no visible lesions, no mosaicism, no punctation, and no abnormal vasculature; SCJ visualized 360 degrees without lesions and no biopsies taken. Vaginal inspection: normal without visible lesions. Vulvar colposcopy: vulvar colposcopy not performed.  Specimens: none  Complications: none.  Colposcopic Impression: Normal colposcopy, no lesions requiring biopsy  Plan(Based on 2019 ASCCP recommendations) Follow up HPV based cytology in 1 year

## 2023-11-02 ENCOUNTER — Ambulatory Visit

## 2023-11-03 ENCOUNTER — Ambulatory Visit

## 2023-11-04 ENCOUNTER — Encounter (HOSPITAL_COMMUNITY): Payer: Self-pay | Admitting: *Deleted
